# Patient Record
Sex: Male | Born: 1940
Health system: Southern US, Community
[De-identification: ages and names within clinical notes are randomized; demographics above are authoritative.]

## PROBLEM LIST (undated history)

## (undated) DIAGNOSIS — E119 Type 2 diabetes mellitus without complications: Secondary | ICD-10-CM

## (undated) DIAGNOSIS — E7800 Pure hypercholesterolemia, unspecified: Secondary | ICD-10-CM

## (undated) DIAGNOSIS — M1990 Unspecified osteoarthritis, unspecified site: Secondary | ICD-10-CM

## (undated) DIAGNOSIS — Z8546 Personal history of malignant neoplasm of prostate: Secondary | ICD-10-CM

## (undated) DIAGNOSIS — I10 Essential (primary) hypertension: Secondary | ICD-10-CM

## (undated) DIAGNOSIS — G4730 Sleep apnea, unspecified: Secondary | ICD-10-CM

## (undated) DIAGNOSIS — E1140 Type 2 diabetes mellitus with diabetic neuropathy, unspecified: Secondary | ICD-10-CM

## (undated) HISTORY — DX: Sleep apnea, unspecified: G47.30

## (undated) HISTORY — DX: Essential (primary) hypertension: I10

## (undated) HISTORY — DX: Type 2 diabetes mellitus with diabetic neuropathy, unspecified: E11.40

## (undated) HISTORY — DX: Type 2 diabetes mellitus without complications: E11.9

## (undated) HISTORY — DX: Personal history of malignant neoplasm of prostate: Z85.46

## (undated) HISTORY — DX: Unspecified osteoarthritis, unspecified site: M19.90

## (undated) HISTORY — DX: Pure hypercholesterolemia, unspecified: E78.00

---

## 1983-06-11 HISTORY — PX: BACK SURGERY: SHX140

## 1997-09-15 ENCOUNTER — Ambulatory Visit (HOSPITAL_COMMUNITY): Admission: RE | Admit: 1997-09-15 | Discharge: 1997-09-15 | Payer: Self-pay | Admitting: Neurosurgery

## 1997-10-11 ENCOUNTER — Inpatient Hospital Stay (HOSPITAL_COMMUNITY): Admission: RE | Admit: 1997-10-11 | Discharge: 1997-10-13 | Payer: Self-pay | Admitting: Neurosurgery

## 2004-11-15 ENCOUNTER — Ambulatory Visit (HOSPITAL_COMMUNITY): Admission: RE | Admit: 2004-11-15 | Discharge: 2004-11-15 | Payer: Self-pay | Admitting: Orthopedic Surgery

## 2005-06-10 HISTORY — PX: HERNIA REPAIR: SHX51

## 2006-06-10 HISTORY — PX: PROSTATE SURGERY: SHX751

## 2014-05-16 ENCOUNTER — Ambulatory Visit (INDEPENDENT_AMBULATORY_CARE_PROVIDER_SITE_OTHER): Payer: 59

## 2014-05-16 VITALS — BP 142/86 | HR 72 | Resp 12

## 2014-05-16 DIAGNOSIS — E1140 Type 2 diabetes mellitus with diabetic neuropathy, unspecified: Secondary | ICD-10-CM

## 2014-05-16 DIAGNOSIS — G629 Polyneuropathy, unspecified: Secondary | ICD-10-CM

## 2014-05-16 DIAGNOSIS — L97521 Non-pressure chronic ulcer of other part of left foot limited to breakdown of skin: Secondary | ICD-10-CM

## 2014-05-16 DIAGNOSIS — IMO0001 Reserved for inherently not codable concepts without codable children: Secondary | ICD-10-CM

## 2014-05-16 DIAGNOSIS — M2040 Other hammer toe(s) (acquired), unspecified foot: Secondary | ICD-10-CM

## 2014-05-16 NOTE — Patient Instructions (Signed)
Diabetes and Foot Care Diabetes may cause you to have problems because of poor blood supply (circulation) to your feet and legs. This may cause the skin on your feet to become thinner, break easier, and heal more slowly. Your skin may become dry, and the skin may peel and crack. You may also have nerve damage in your legs and feet causing decreased feeling in them. You may not notice minor injuries to your feet that could lead to infections or more serious problems. Taking care of your feet is one of the most important things you can do for yourself.  HOME CARE INSTRUCTIONS  Wear shoes at all times, even in the house. Do not go barefoot. Bare feet are easily injured.  Check your feet daily for blisters, cuts, and redness. If you cannot see the bottom of your feet, use a mirror or ask someone for help.  Wash your feet with warm water (do not use hot water) and mild soap. Then pat your feet and the areas between your toes until they are completely dry. Do not soak your feet as this can dry your skin.  Apply a moisturizing lotion or petroleum jelly (that does not contain alcohol and is unscented) to the skin on your feet and to dry, brittle toenails. Do not apply lotion between your toes.  Trim your toenails straight across. Do not dig under them or around the cuticle. File the edges of your nails with an emery board or nail file.  Do not cut corns or calluses or try to remove them with medicine.  Wear clean socks or stockings every day. Make sure they are not too tight. Do not wear knee-high stockings since they may decrease blood flow to your legs.  Wear shoes that fit properly and have enough cushioning. To break in new shoes, wear them for just a few hours a day. This prevents you from injuring your feet. Always look in your shoes before you put them on to be sure there are no objects inside.  Do not cross your legs. This may decrease the blood flow to your feet.  If you find a minor scrape,  cut, or break in the skin on your feet, keep it and the skin around it clean and dry. These areas may be cleansed with mild soap and water. Do not cleanse the area with peroxide, alcohol, or iodine.  When you remove an adhesive bandage, be sure not to damage the skin around it.  If you have a wound, look at it several times a day to make sure it is healing.  Do not use heating pads or hot water bottles. They may burn your skin. If you have lost feeling in your feet or legs, you may not know it is happening until it is too late.  Make sure your health care provider performs a complete foot exam at least annually or more often if you have foot problems. Report any cuts, sores, or bruises to your health care provider immediately. SEEK MEDICAL CARE IF:   You have an injury that is not healing.  You have cuts or breaks in the skin.  You have an ingrown nail.  You notice redness on your legs or feet.  You feel burning or tingling in your legs or feet.  You have pain or cramps in your legs and feet.  Your legs or feet are numb.  Your feet always feel cold. SEEK IMMEDIATE MEDICAL CARE IF:   There is increasing redness,   swelling, or pain in or around a wound.  There is a red line that goes up your leg.  Pus is coming from a wound.  You develop a fever or as directed by your health care provider.  You notice a bad smell coming from an ulcer or wound. Document Released: 05/24/2000 Document Revised: 01/27/2013 Document Reviewed: 11/03/2012 ExitCare Patient Information 2015 [employer]. This information is not intended to replace advice given to you by your health care provider. Make sure you discuss any questions you have with your health care provider.  

## 2014-05-16 NOTE — Progress Notes (Signed)
   Subjective:    Patient ID: Tim Turner, male    DOB: 1940-08-02, 73 y.o.   MRN: [id number]  HPI  PT STATED BEEN GOING TO THE WOUND CENTER FOR THE LEFT FOR 2 MONTHS. THE FOOT IS GETTING BETTER BUT STILL THERE. THE FOOT GET AGGRAVATED BY WALKING AND PRESSURE.  ALSO, WOULD LIKE TO HAVE PAIR DIABETIC SHOES. Review of Systems  HENT: Positive for hearing loss.   Cardiovascular: Positive for leg swelling.  All other systems reviewed and are negative.      Objective:   Physical Exam 73 year old white male well-developed well-nourished oriented 3 presents at this time for evaluation of diabetic foot and neuropathy issues. Patient has had recent healing of the ulcer sub-first MTP area left there is some hemorrhage a keratoses present which is debrided away at this time revealing a healed ulcer down to dermal level no discharge drainage no active infection noted. Lower extremity findings as follows vascular status is intact although diminished pedal pulses DP plus one over 4 bilateral PT thready to nonpalpable bilateral +1 edema noted bilateral Refill time 3 seconds all digits epicritic and proprioceptive sensations grossly diminished on Semmes Weinstein to the forefoot digits and arch intact sensation of the dorsum of the foot and ankle. There is normal plantar response area DTRs not elicited. Dermatologic the skin color pigment normal hair growth absent nails somewhat criptotic and incurvated. There is keratoses sub-1 bilateral due to plantar grade metatarsal digital contractures is also hammering of toes 234 and 5 with adductovarus rotated fourth and fifth digits. No x-rays taken at this time patient again recently healed ulceration at the wound center. Wearing a pair of new balance athletic type shoes. Patient does not go barefoot again has some contractures and digital deformities associated with diabetes as well as neuropathy and angiopathy issues.       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 Digital deformities with plantar grade metatarsal in history of ulceration which is resolved at this time however there is high incidence for recurrence and high likely this recurs due to his neuropathy and angiopathy situation might benefit from future vascular consultation and evaluation Doppler ultrasounds ABIs and tibialis to assess his vascular flow although at this time seems to be healing adequately. Nails somewhat criptotic patient is doing self-care may be candidate for further diabetic foot and nail care in the future as needed however at this time we'll obtain authorization for diabetic extra depth shoes and 3 pairs of custom molded dual density Plastizote inlays. The shoes inlays we helpful in preventing recurrence of ulceration and allowing patient functionality and ambulation. Authorization for shoes will be obtained from his primary physician Dr. Lisbeth Ply patient may recheck within the next month for orthotic measurement and foam impressions and management for new diabetic shoes once authorized and available. In the interim literature on diabetic footcare is also dispensed for patient's use  Harriet Masson DPM

## 2014-06-20 ENCOUNTER — Ambulatory Visit (INDEPENDENT_AMBULATORY_CARE_PROVIDER_SITE_OTHER): Payer: Medicare Other | Admitting: *Deleted

## 2014-06-20 DIAGNOSIS — IMO0001 Reserved for inherently not codable concepts without codable children: Secondary | ICD-10-CM

## 2014-06-20 DIAGNOSIS — L97521 Non-pressure chronic ulcer of other part of left foot limited to breakdown of skin: Secondary | ICD-10-CM

## 2014-06-20 NOTE — Patient Instructions (Signed)
Patient is here to get measured for diabetic shoes. Tim Turner

## 2014-06-20 NOTE — Progress Notes (Signed)
   Subjective:    Patient ID: Tim Turner, male    DOB: 11/08/1940, 74 y.o.   MRN: [id number]  HPI DIABETIC SHOE MEASUREMENT.   Review of Systems     Objective:   Physical Exam        Assessment & Plan:

## 2014-09-05 ENCOUNTER — Ambulatory Visit: Payer: Medicare Other

## 2014-09-15 ENCOUNTER — Encounter: Payer: Self-pay | Admitting: Podiatrist

## 2014-09-15 ENCOUNTER — Ambulatory Visit: Payer: Medicare Other | Admitting: Podiatrist

## 2014-09-15 VITALS — BP 133/66 | HR 73 | Resp 18

## 2014-09-15 DIAGNOSIS — M2040 Other hammer toe(s) (acquired), unspecified foot: Secondary | ICD-10-CM

## 2014-09-15 DIAGNOSIS — E1140 Type 2 diabetes mellitus with diabetic neuropathy, unspecified: Secondary | ICD-10-CM

## 2014-09-15 DIAGNOSIS — G629 Polyneuropathy, unspecified: Secondary | ICD-10-CM

## 2014-10-07 NOTE — Progress Notes (Signed)
patient presents today to pick up his diabetic shoes he states are comfortable and free of defect. He is given instructions for wear and for the use of the inserts. He'll be seen back for recheck and if any concerns arise he'll call

## 2015-05-03 ENCOUNTER — Ambulatory Visit (INDEPENDENT_AMBULATORY_CARE_PROVIDER_SITE_OTHER)
Admission: RE | Admit: 2015-05-03 | Discharge: 2015-05-03 | Disposition: A | Discharge status: Home / Self Care | Payer: Medicare Other | Source: Ambulatory Visit | Attending: Internal Medicine | Admitting: Internal Medicine

## 2015-05-03 ENCOUNTER — Ambulatory Visit (INDEPENDENT_AMBULATORY_CARE_PROVIDER_SITE_OTHER): Payer: Medicare Other | Admitting: Internal Medicine

## 2015-05-03 ENCOUNTER — Encounter: Payer: Self-pay | Admitting: Internal Medicine

## 2015-05-03 VITALS — BP 126/60 | HR 80 | Ht 70.0 in | Wt 239.6 lb

## 2015-05-03 DIAGNOSIS — R05 Cough: Secondary | ICD-10-CM | POA: Diagnosis not present

## 2015-05-03 DIAGNOSIS — G4733 Obstructive sleep apnea (adult) (pediatric): Secondary | ICD-10-CM | POA: Insufficient documentation

## 2015-05-03 DIAGNOSIS — J929 Pleural plaque without asbestos: Secondary | ICD-10-CM | POA: Insufficient documentation

## 2015-05-03 DIAGNOSIS — J189 Pneumonia, unspecified organism: Secondary | ICD-10-CM | POA: Diagnosis not present

## 2015-05-03 DIAGNOSIS — R053 Chronic cough: Secondary | ICD-10-CM | POA: Insufficient documentation

## 2015-05-03 MED ORDER — FAMOTIDINE 20 MG PO TABS: ORAL_TABLET | ORAL | Status: DC

## 2015-05-03 NOTE — Progress Notes (Signed)
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   05/03/2015 1st [facility] office visit/ Tim Turner   Chief Complaint  Patient presents with  . Advice Only    self referral-pt recently hospitalized at [facility] for pna, was told to establish with Pulm for b/l plaques on lower lobes.    developed chronic min prod  Cough x 1-2 tsp clear mucus x around a decade prior to OV   esp in am's p stirring and some better rest the day but never really gone assoc with doe x parking lot or two flights Around Apr 11 2015  much more cough more production yellow mucus/ low grade admitted [facility] 11/6-11/16 for CAP cough is some better, fever gone energy not better and sleepy a lot more than usual despite use of cpap and has not notified Tim Turner and wishes a second opinion. 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   No obvious other patterns in day to day or daytime variabilty or assoc  cp or chest tightness, subjective wheeze overt sinus or hb symptoms. No unusual exp hx or h/o childhood pna/ asthma or knowledge of premature birth.  Sleeping ok without nocturnal  or early am exacerbation  of respiratory  c/o's or need for noct saba. Also denies any obvious fluctuation of symptoms with weather or environmental changes or other aggravating or alleviating factors except as outlined above   Current Medications, Allergies, Complete Past Medical History, Past Surgical History, Family History, and Social History were reviewed in [employer] record.              Review of Systems  Constitutional: Negative for fever and unexpected weight change.  HENT: Positive  for congestion. Negative for dental problem, ear pain, nosebleeds, postnasal drip, rhinorrhea, sinus pressure, sneezing, sore throat and trouble swallowing.   Eyes: Negative for redness and itching.  Respiratory: Positive for cough and shortness of breath. Negative for chest tightness and wheezing.   Cardiovascular: Negative for palpitations and leg swelling.  Gastrointestinal: Negative for nausea and vomiting.  Genitourinary: Negative for dysuria.  Musculoskeletal: Negative for joint swelling.  Skin: Negative for rash.  Neurological: Negative for headaches.  Hematological: Does not bruise/bleed easily.  Psychiatric/Behavioral: Negative for dysphoric mood. The patient is not nervous/anxious.        Objective:   Physical Exam  amb wm nad  Wt Readings from Last 3 Encounters:  05/03/15 239 lb 9.6 oz (108.682 kg)    Vital signs reviewed   HEENT: nl dentition, turbinates, and oropharynx. Nl external ear canals without cough reflex   NECK :  without JVD/Nodes/TM/ nl carotid upstrokes bilaterally   LUNGS: no acc muscle use, clear to A and P bilaterally without cough on insp or exp maneuvers   CV:  RRR  no s3 or murmur or increase in P2, no edema   ABD:  soft and nontender with nl excursion in the supine position. No bruits or organomegaly, bowel sounds nl  MS:  warm without deformities, calf tenderness, cyanosis or clubbing  SKIN: warm and dry without lesions  NEURO:  alert, approp, no deficits     CXR PA and Lateral:   05/03/2015 :    I personally reviewed images and agree with radiology impression as follows:   Stable bilateral noncalcified pleural plaque. No active lung disease.      Assessment & Plan:

## 2015-05-03 NOTE — Assessment & Plan Note (Signed)
Complicated by dm and osa  Body mass index is 34.38 kg/(m^2).  No results found for: TSH   Contributing to gerd tendency/ doe/reviewed the need and the process to achieve and maintain neg calorie balance > defer f/u primary care including intermittently monitoring thyroid status

## 2015-05-03 NOTE — Assessment & Plan Note (Signed)
The most common causes of chronic cough in immunocompetent adults include the following: upper airway cough syndrome (UACS), previously referred to as postnasal drip syndrome (PNDS), which is caused by variety of rhinosinus conditions; (2) asthma; (3) GERD; (4) chronic bronchitis from cigarette smoking or other inhaled environmental irritants; (5) nonasthmatic eosinophilic bronchitis; and (6) bronchiectasis.   These conditions, singly or in combination, have accounted for up to 94% of the causes of chronic cough in prospective studies.   Other conditions have constituted no >6% of the causes in prospective studies These have included bronchogenic carcinoma, chronic interstitial pneumonia, sarcoidosis, left ventricular failure, ACEI-induced cough, and aspiration from a condition associated with pharyngeal dysfunction.    Chronic cough is often simultaneously caused by more than one condition. A single cause has been found from 38 to 82% of the time, multiple causes from 18 to 62%. Multiply caused cough has been the result of three diseases up to 42% of the time.       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   These are the same pts (now being labeled as having "irritable larynx syndrome" by some cough centers) who not infrequently have a history of having failed to tolerate ace inhibitors,  dry powder inhalers or biphosphonates or report having atypical reflux symptoms that don't respond to standard doses of PPI , and are easily confused as  having aecopd or asthma flares by even experienced allergists/ pulmonologists.   The first step is to maximize acid suppression and eliminate cyclical coughing then regroup with pfts  I had an extended discussion with the patient reviewing all relevant studies completed to date and  lasting 35 min/ 60 min ov  1) Explained: The standardized cough guidelines published in Chest by Lissa Morales in 2006 are still the best available and consist of a multiple step process (up to 12!) , not a single office visit,  and are intended  to address this problem logically,  with an alogrithm dependent on response to empiric treatment at  each progressive step  to determine a specific diagnosis with  minimal addtional testing needed. Therefore if adherence is an issue or can't be accurately verified,  it's very unlikely the standard evaluation and treatment will be successful here.    Furthermore, response to therapy (other than acute cough suppression, which should only be used short term with avoidance of narcotic containing cough syrups if possible), can be a gradual process for which the patient may perceive immediate benefit.  Unlike going to an eye doctor where the best perscription is almost always the first one and is immediately effective, this is almost never the case in the management of chronic cough syndromes. Therefore the patient needs to commit up front to consistently adhere to recommendations  for up to 6 weeks of therapy directed at the likely underlying problem(s) before the response can be reasonably evaluated.     1)  Each maintenance medication was reviewed in detail including most importantly the difference between maintenance and prns and  under what circumstances the prns are to be triggered using an action plan format that is not reflected in the computer generated alphabetically organized AVS.    Please see instructions for details which were reviewed in writing and the patient given a  copy highlighting the part that I personally wrote and discussed at today's ov.   See instructions for specific recommendations which were reviewed directly with the patient who was given a copy with highlighter outlining the key components.

## 2015-05-03 NOTE — Assessment & Plan Note (Signed)
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 He declined returning to Dr.[name] but I do believe he is using his cpap machine appropriately and he did agree to having it undergo maintenance and a download to establish whether or not it is effective. In the meantime I strongly cautioned against driving at all if all sleepy and he reported to me that that's never been an issue for him since starting on CPAP.

## 2015-05-03 NOTE — Patient Instructions (Addendum)
Please remember to go to the x-ray department downstairs for your tests - we will call you with the results when they are available.  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    GERD (REFLUX)  is an extremely common cause of respiratory symptoms just like yours , many times with no obvious heartburn at all.    It can be treated with medication, but also with lifestyle changes including elevation of the head of your bed (ideally with 6 inch  bed blocks),  Smoking cessation, avoidance of late meals, excessive alcohol, and avoid fatty foods, chocolate, peppermint, colas, red wine, and acidic juices such as orange juice.  NO MINT OR MENTHOL PRODUCTS SO NO COUGH DROPS  USE SUGARLESS CANDY INSTEAD (Jolley ranchers or Stover's or Life Savers) or even ice chips will also do - the key is to swallow to prevent all throat clearing. NO OIL BASED VITAMINS - use powdered substitutes - stop the fish oil and eat more fish  Please see patient coordinator before you leave today  to schedule a cpap maintence and download   Please schedule a follow up office visit in 6 weeks, call sooner if needed with pfts

## 2015-05-03 NOTE — Assessment & Plan Note (Signed)
cxr 05/03/2015 c/w resolved cap / no further f/u needed

## 2015-05-03 NOTE — Assessment & Plan Note (Signed)
Present since at least March 2012 by CT scan with a history of asbestos exposure but stopped in the early 80s  This places him at risk of developing interstitial lung disease/asbestosis but there is no evidence that he has this at present and will return for full PFTs and baseline walking saturation.

## 2015-06-16 ENCOUNTER — Ambulatory Visit: Payer: Medicare Other | Admitting: Internal Medicine

## 2015-06-19 ENCOUNTER — Other Ambulatory Visit: Payer: Self-pay | Admitting: Internal Medicine

## 2015-06-19 MED ORDER — FAMOTIDINE 20 MG PO TABS: ORAL_TABLET | ORAL | Status: DC

## 2015-07-04 ENCOUNTER — Ambulatory Visit (INDEPENDENT_AMBULATORY_CARE_PROVIDER_SITE_OTHER): Payer: Medicare Other | Admitting: Internal Medicine

## 2015-07-04 ENCOUNTER — Encounter: Payer: Self-pay | Admitting: Internal Medicine

## 2015-07-04 ENCOUNTER — Ambulatory Visit (INDEPENDENT_AMBULATORY_CARE_PROVIDER_SITE_OTHER)
Admission: RE | Admit: 2015-07-04 | Discharge: 2015-07-04 | Disposition: A | Discharge status: Home / Self Care | Payer: Medicare Other | Source: Ambulatory Visit | Attending: Internal Medicine | Admitting: Internal Medicine

## 2015-07-04 VITALS — BP 144/68 | HR 78 | Ht 69.5 in | Wt 247.0 lb

## 2015-07-04 DIAGNOSIS — R05 Cough: Secondary | ICD-10-CM

## 2015-07-04 DIAGNOSIS — J189 Pneumonia, unspecified organism: Secondary | ICD-10-CM | POA: Diagnosis not present

## 2015-07-04 DIAGNOSIS — R053 Chronic cough: Secondary | ICD-10-CM

## 2015-07-04 DIAGNOSIS — J929 Pleural plaque without asbestos: Secondary | ICD-10-CM

## 2015-07-04 DIAGNOSIS — G4733 Obstructive sleep apnea (adult) (pediatric): Secondary | ICD-10-CM | POA: Diagnosis not present

## 2015-07-04 LAB — PULMONARY FUNCTION TEST
DL/VA % PRED: 101 %
DL/VA: 4.63 ml/min/mmHg/L
DLCO unc % pred: 73 %
DLCO unc: 23.19 ml/min/mmHg
FEF 25-75 POST: 1.9 L/s
FEF 25-75 Pre: 1.58 L/sec
FEF2575-%CHANGE-POST: 20 %
FEF2575-%PRED-POST: 84 %
FEF2575-%PRED-PRE: 69 %
FEV1-%CHANGE-POST: 2 %
FEV1-%PRED-PRE: 74 %
FEV1-%Pred-Post: 76 %
FEV1-PRE: 2.29 L
FEV1-Post: 2.35 L
FEV1FVC-%CHANGE-POST: 4 %
FEV1FVC-%PRED-PRE: 100 %
FEV6-%Change-Post: 0 %
FEV6-%Pred-Post: 77 %
FEV6-%Pred-Pre: 77 %
FEV6-POST: 3.07 L
FEV6-Pre: 3.07 L
FEV6FVC-%Change-Post: 1 %
FEV6FVC-%PRED-POST: 106 %
FEV6FVC-%Pred-Pre: 105 %
FVC-%CHANGE-POST: -1 %
FVC-%PRED-POST: 72 %
FVC-%PRED-PRE: 73 %
FVC-POST: 3.07 L
FVC-PRE: 3.11 L
PRE FEV1/FVC RATIO: 73 %
PRE FEV6/FVC RATIO: 99 %
Post FEV1/FVC ratio: 77 %
Post FEV6/FVC ratio: 100 %
RV % pred: 93 %
RV: 2.33 L
TLC % pred: 79 %
TLC: 5.49 L

## 2015-07-04 NOTE — Progress Notes (Signed)
PFT done today. 

## 2015-07-04 NOTE — Patient Instructions (Addendum)
mucinex dm up to 1200 mg every 12 hours as needed for cough   GERD (REFLUX)  is an extremely common cause of respiratory symptoms just like yours , many times with no obvious heartburn at all.    It can be treated with medication, but also with lifestyle changes including elevation of the head of your bed (ideally with 6 inch  bed blocks),  Smoking cessation, avoidance of late meals, excessive alcohol, and avoid fatty foods, chocolate, peppermint, colas, red wine, and acidic juices such as orange juice.  NO MINT OR MENTHOL PRODUCTS SO NO COUGH DROPS  USE SUGARLESS CANDY INSTEAD (Jolley ranchers or Stover's or Life Savers) or even ice chips will also do - the key is to swallow to prevent all throat clearing. NO OIL BASED VITAMINS - use powdered substitutes.   Work on [employer] loss     Your sleep test looks good so we will set you up for a sleep doctor in 6 months

## 2015-07-04 NOTE — Progress Notes (Signed)
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   History of Present Illness  05/03/2015 1st [facility] office visit/ Tim Turner   Chief Complaint  Patient presents with  . Advice Only    self referral-pt recently hospitalized at [facility] for pna, was told to establish with Pulm for b/l plaques on lower lobes.    developed chronic min prod  Cough x 1-2 tsp clear mucus x around a decade prior to OV   esp in am's p stirring and some better rest the day but never really gone assoc with doe x parking lot or two flights Around Apr 11 2015  much more cough more production yellow mucus/ low grade admitted [facility] 11/6-11/16 for CAP cough is some better, fever gone energy not better and sleepy a lot more than usual despite use of cpap and has not notified Dr Tim Turner and wishes a second opinion. 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   GERD  Diet         07/04/2015  f/u ov/Tim Turner re: cough/ osa  Chief Complaint  Patient presents with  . Follow-up    PFT and CXR done today. Pt c/o SOB for the past wk- walking "not very far". He also has some prod cough with clear sputum. He has been using neb 1-2 x per day on average for the past wk.    he states he was feeling much better until he caught a cold over the last week but feels his symptoms are well controlled with the use of  albuterol.  No obvious day to day or daytime variability or assoc   cp or chest tightness, subjective wheeze or overt sinus or hb symptoms. No unusual exp hx or h/o childhood pna/ asthma or knowledge of premature birth.  Sleeping ok without nocturnal  or early am exacerbation  of respiratory  c/o's or need for noct saba. Also denies any obvious fluctuation of symptoms with weather or environmental changes or other aggravating or alleviating factors except as outlined above   Current Medications, Allergies, Complete Past Medical History, Past Surgical History, Family History, and Social History were reviewed in [employer] record.  ROS  The following are not active complaints unless bolded sore throat, dysphagia, dental problems, itching, sneezing,  nasal congestion or excess/ purulent secretions, ear ache,   fever, chills, sweats, unintended wt loss, classically pleuritic or exertional cp, hemoptysis,  orthopnea pnd or leg swelling, presyncope, palpitations, abdominal pain, anorexia, nausea, vomiting, diarrhea  or change in bowel or bladder habits, change in stools or urine, dysuria,hematuria,  rash, arthralgias, visual complaints, headache, numbness, weakness or ataxia or problems with walking or coordination,  change in mood/affect or memory.  Objective:   Physical Exam  amb wm nad   Wt Readings from Last 3 Encounters:  07/04/15 247 lb (112.038 kg)  05/03/15 239 lb 9.6 oz (108.682 kg)    Vital signs reviewed   HEENT: nl dentition, turbinates, and oropharynx. Nl external ear canals without cough reflex   NECK :  without JVD/Nodes/TM/ nl carotid upstrokes bilaterally   LUNGS: no acc muscle use, clear to A and P bilaterally without cough on insp or exp maneuvers   CV:  RRR  no s3 or murmur or increase in P2, no edema   ABD:  soft and nontender with nl excursion in the supine position. No bruits or organomegaly, bowel sounds nl  MS:  warm  without deformities, calf tenderness, cyanosis or clubbing  SKIN: warm and dry without lesions    NEURO:  alert, approp, no deficits      CXR PA and Lateral:   07/04/2015 :    I personally reviewed images and agree with radiology impression as follows:   1. Cardiomegaly. No overt pulmonary edema. 2. No acute cardiopulmonary disease. Stable bilateral pleural noncalcified plaques are noted. Chest is stable from 05/03/2015.        Assessment & Plan:

## 2015-07-05 NOTE — Assessment & Plan Note (Addendum)
Present since at least March 2012 by CT scan with a history of asbestos exposure but stopped in the early 80s  Malignant transformation of plaques is very rare and No evidence of significant ILD clinically or radiographically though he is at risk and follow up yearly reasonable but he'll need that here anyway for his OSA.

## 2015-07-05 NOTE — Assessment & Plan Note (Signed)
PFT's  07/04/2015  FEV1 2/35 (76 % ) ratio 77  p 2 % improvement from saba with DLCO  73 % corrects to 101 % for alv volume    Despite flare with an apparent uri there is no evidence at all of asthma / cough vairant or otherwise here so no need for chronic rx other than continue efforts to control GERD through diet/max acid suppression and f/u prn  I had an extended discussion with the patient reviewing all relevant studies completed to date and  lasting 15 to 20 minutes of a 25 minute visit    Each maintenance medication was reviewed in detail including most importantly the difference between maintenance and prns and under what circumstances the prns are to be triggered using an action plan format that is not reflected in the computer generated alphabetically organized AVS.    Please see instructions for details which were reviewed in writing and the patient given a copy highlighting the part that I personally wrote and discussed at today's ov.

## 2015-07-05 NOTE — Assessment & Plan Note (Signed)
Download completed 07/03/15 > 4 h 100% ,  autoset 5/20,  AHI 2.4   Clearly using it, adequate setting, elimination of daytime symtpoms> referred to sleep medicine for continued f/u @ 6 m

## 2015-07-05 NOTE — Assessment & Plan Note (Signed)
cxr clear/ no further f/u needed

## 2015-07-05 NOTE — Assessment & Plan Note (Addendum)
Complicated by dm and osa and ERV 46% on pfts 07/04/2015  Body mass index is 35.96  So still trending up   No results found for: TSH   Contributing to gerd tendency/ doe/reviewed the need and the process to achieve and maintain neg calorie balance > defer f/u primary care including intermittently monitoring thyroid status

## 2015-07-21 ENCOUNTER — Encounter: Payer: Self-pay | Admitting: Internal Medicine

## 2015-10-01 ENCOUNTER — Other Ambulatory Visit: Payer: Self-pay | Admitting: Internal Medicine

## 2016-01-01 ENCOUNTER — Other Ambulatory Visit (INDEPENDENT_AMBULATORY_CARE_PROVIDER_SITE_OTHER): Payer: Medicare Other

## 2016-01-01 ENCOUNTER — Ambulatory Visit (INDEPENDENT_AMBULATORY_CARE_PROVIDER_SITE_OTHER): Payer: Medicare Other | Admitting: Internal Medicine

## 2016-01-01 ENCOUNTER — Encounter: Payer: Self-pay | Admitting: Internal Medicine

## 2016-01-01 VITALS — BP 128/76 | HR 76 | Ht 70.0 in | Wt 232.2 lb

## 2016-01-01 DIAGNOSIS — G4733 Obstructive sleep apnea (adult) (pediatric): Secondary | ICD-10-CM

## 2016-01-01 DIAGNOSIS — R011 Cardiac murmur, unspecified: Secondary | ICD-10-CM | POA: Diagnosis not present

## 2016-01-01 DIAGNOSIS — E032 Hypothyroidism due to medicaments and other exogenous substances: Secondary | ICD-10-CM

## 2016-01-01 LAB — TSH: TSH: 1.44 u[IU]/mL (ref 0.35–4.50)

## 2016-01-01 MED ORDER — METHYLPHENIDATE HCL 10 MG PO TABS: ORAL_TABLET | ORAL | 0 refills | Status: DC

## 2016-01-01 NOTE — Assessment & Plan Note (Signed)
Tight sounding high-pitched murmur without symptoms of syncope or chest pain. Plan-echocardiogram

## 2016-01-01 NOTE — Assessment & Plan Note (Signed)
Download and wife's report both indicate excellent compliance and control. He is quite comfortable with pressure auto range 5-20 using nasal mask. Current machine about 37 or 74 years old. Not clear why he he continues to describe hypersomnolence. This was not an issue when he first began using CPAP and may be a separate problem. He denies cataplexy or sleep paralysis. He does notice leg jerks and we may need to try treating with Requip. Plan-discussed basic sleep hygiene. Okay to try caffeine tablet or very cautious use of Ritalin as discussed in detail. Lab for TSH to exclude hypothyroid. Consider if PSG with an MSLT would be required.

## 2016-01-01 NOTE — Assessment & Plan Note (Signed)
Would medically benefit from significant weight loss. Consider bariatric referral for counseling.

## 2016-01-01 NOTE — Patient Instructions (Signed)
Suggest trying otc caffeine tablet like NoDoz  Order- lab TSH    Dx hypothyroid  Script printed to try Ritalin  Naps will work better than any drug, so take naps still,m when you can do it safely.   Order- schedule Echocardiogram   Dx systolic murmur  We can continue CPAP auto 5-20/ [facility]  Follow up with Dr Melvyn Novas for your breathing as planned

## 2016-01-01 NOTE — Progress Notes (Signed)
01/01/2016-75 year old male with history OSA/CPAP referred by Dr. Melvyn Novas for sleep medicine evaluation. Patient had been diagnosed and treated by Dr. [name], but seeking second opinion. NPSG 08/03/03- AHI 29/ hr, desaturation to 74%, body weight 210 pounds CPAP auto 5-20/[facility] Uses CPAP nightly-[facility] in [place]. Pt is a Administrator. Wife is here and helps with history. They are team commercial drivers. Current CPAP machine is 12 or 74 years old and working well. He has been comfortable with pressure using a nasal mask. Wife says he uses it every night, confirmed by download report. Rarely snores through. 2 or 3 years he has been much sleepier during the day despite 10 hours of apparently comfortable restful sleep at night. Gets drowsy and will go take a nap for 2 hours most days after lunch. No trouble staying awake if he is doing something, like driving. Wife says he moves his legs a lot with no sleepwalking. He denies symptoms suggestive of cataplexy or sleep paralysis. ENT-tonsillectomy as a child Dr. Melvyn Novas is helping him with chronic lung disease. Doesn't think he has ever been tested for thyroid function.  Prior to Admission medications   Medication Sig Start Date End Date Taking? Authorizing Provider  amLODipine (NORVASC) 10 MG tablet Take 10 mg by mouth daily.  03/04/14  Yes Historical Provider, MD  citalopram (CELEXA) 20 MG tablet Take 20 mg by mouth daily.  04/13/14  Yes Historical Provider, MD  famotidine (PEPCID) 20 MG tablet TAKE 1 TABLET BY MOUTH AT BEDTIME 10/02/15  Yes Tanda Rockers, MD  gemfibrozil (LOPID) 600 MG tablet Take 600 mg by mouth 2 (two) times daily before a meal.  05/04/14  Yes Historical Provider, MD  glipiZIDE (GLUCOTROL XL) 10 MG 24 hr tablet Take 10 mg by mouth daily with breakfast.   Yes Historical Provider, MD  ipratropium-albuterol (DUONEB) 0.5-2.5 (3) MG/3ML SOLN Take 3 mLs by nebulization every 6 (six) hours as needed.   Yes Historical  Provider, MD  losartan (COZAAR) 50 MG tablet Take 50 mg by mouth daily.  03/24/14  Yes Historical Provider, MD  metFORMIN (GLUCOPHAGE-XR) 500 MG 24 hr tablet Take 1,000 mg by mouth daily with breakfast.  03/15/14  Yes Historical Provider, MD  metoprolol tartrate (LOPRESSOR) 25 MG tablet Take 25 mg by mouth 2 (two) times daily.   Yes Historical Provider, MD  pantoprazole (PROTONIX) 40 MG tablet Take 40 mg by mouth daily.  03/05/14  Yes Historical Provider, MD  methylphenidate (RITALIN) 10 MG tablet 1 twice daily as needed 01/01/16   Deneise Lever, MD   Past Medical History:  Diagnosis Date  . Arthritis   . Diabetes mellitus without complication ([place])   . History of prostate cancer   . Hypercholesteremia   . Hypertension   . Sleep apnea    Past Surgical History:  Procedure Laterality Date  . BACK SURGERY  1985   nerve decompression  . HERNIA REPAIR  2007   2 surgeries  . PROSTATE SURGERY  2008   Family History  Problem Relation Age of Onset  . Heart disease Mother   . Breast cancer Mother   . Heart disease Father    Social History   Social History  . Marital status: Married    Spouse name: N/A  . Number of children: N/A  . Years of education: N/A   Occupational History  . Not on file.   Social History Main Topics  . Smoking status: Former Smoker  Packs/day: 4.00    Years: 35.00    Types: Cigarettes    Quit date: 05/02/1985  . Smokeless tobacco: Never Used  . Alcohol use No  . Drug use: No  . Sexual activity: Not on file   Other Topics Concern  . Not on file   Social History Narrative  . No narrative on file   ROS-see HPI   Negative unless "+" Constitutional:    weight loss, night sweats, fevers, chills,+ fatigue, lassitude. HEENT:    headaches, difficulty swallowing, tooth/dental problems, sore throat,       sneezing, itching, ear ache, nasal congestion, post nasal drip, snoring CV:    chest pain, orthopnea, PND, swelling in lower extremities, anasarca,                                                       dizziness, palpitations Resp:   shortness of breath with exertion or at rest.                productive cough,   non-productive cough, coughing up of blood.              change in color of mucus.  wheezing.   Skin:    rash or lesions. GI:  No-   heartburn, indigestion, abdominal pain, nausea, vomiting, diarrhea,                 change in bowel habits, loss of appetite GU: dysuria, change in color of urine, no urgency or frequency.   flank pain. MS:   joint pain, stiffness, decreased range of motion, back pain. Neuro-     nothing unusual Psych:  change in mood or affect.  depression or anxiety.   memory loss.  OBJ- Physical Exam General- Alert, Oriented, Affect-appropriate, Distress- none acute + morbidly obese Skin- rash-none, lesions- none, excoriation- none Lymphadenopathy- none Head- atraumatic            Eyes- Gross vision intact, PERRLA, conjunctivae and secretions clear            Ears- Hearing, canals-normal            Nose- Clear, no-Septal dev, mucus, polyps, erosion, perforation             Throat- Mallampati IV , mucosa clear , drainage- none, tonsils- atrophic Neck- flexible , trachea midline, no stridor , thyroid nl, carotid no bruit Chest - symmetrical excursion , unlabored           Heart/CV- RRR ,  Murmur+ 1/6 high-pitched systolic left sternal border , no gallop  , no rub, nl s1 s2                           - JVD- none , edema- none, stasis changes- none, varices- none           Lung- clear to P&A, wheeze- none, cough- none , dullness-none, rub- none           Chest wall-  Abd-  Br/ Gen/ Rectal- Not done, not indicated Extrem- cyanosis- none, clubbing, none, atrophy- none, strength- nl Neuro- grossly intact to observation

## 2016-01-04 ENCOUNTER — Telehealth: Payer: Self-pay | Admitting: Internal Medicine

## 2016-01-04 NOTE — Telephone Encounter (Signed)
Notes Recorded by Deneise Lever, MD on 01/02/2016 at 10:29 AM EDT Thyroid test was normal  Results have been explained to patient, pt expressed understanding. Nothing further needed.

## 2016-01-12 ENCOUNTER — Other Ambulatory Visit (HOSPITAL_COMMUNITY): Payer: Medicare Other

## 2016-01-16 ENCOUNTER — Ambulatory Visit (HOSPITAL_COMMUNITY): Payer: Medicare Other | Attending: Cardiology

## 2016-01-16 ENCOUNTER — Other Ambulatory Visit: Payer: Self-pay

## 2016-01-16 ENCOUNTER — Encounter: Payer: Self-pay | Admitting: Internal Medicine

## 2016-01-16 DIAGNOSIS — E119 Type 2 diabetes mellitus without complications: Secondary | ICD-10-CM | POA: Insufficient documentation

## 2016-01-16 DIAGNOSIS — I340 Nonrheumatic mitral (valve) insufficiency: Secondary | ICD-10-CM | POA: Insufficient documentation

## 2016-01-16 DIAGNOSIS — Z87891 Personal history of nicotine dependence: Secondary | ICD-10-CM | POA: Insufficient documentation

## 2016-01-16 DIAGNOSIS — G4733 Obstructive sleep apnea (adult) (pediatric): Secondary | ICD-10-CM | POA: Diagnosis not present

## 2016-01-16 DIAGNOSIS — I77810 Thoracic aortic ectasia: Secondary | ICD-10-CM | POA: Insufficient documentation

## 2016-01-16 DIAGNOSIS — R011 Cardiac murmur, unspecified: Secondary | ICD-10-CM | POA: Diagnosis not present

## 2016-01-16 DIAGNOSIS — I341 Nonrheumatic mitral (valve) prolapse: Secondary | ICD-10-CM | POA: Diagnosis not present

## 2016-01-16 DIAGNOSIS — Z6833 Body mass index (BMI) 33.0-33.9, adult: Secondary | ICD-10-CM | POA: Insufficient documentation

## 2016-01-16 DIAGNOSIS — E785 Hyperlipidemia, unspecified: Secondary | ICD-10-CM | POA: Insufficient documentation

## 2016-01-16 DIAGNOSIS — I119 Hypertensive heart disease without heart failure: Secondary | ICD-10-CM | POA: Diagnosis not present

## 2016-01-16 LAB — ECHOCARDIOGRAM COMPLETE
AOASC: 35 cm
CHL CUP LV S' LATERAL: 12.6 cm/s
EERAT: 12.16
EWDT: 246 ms
FS: 42 % (ref 28–44)
IVS/LV PW RATIO, ED: 1.13
LA diam index: 2.21 cm/m2
LA vol index: 18.9 mL/m2
LASIZE: 49 mm
LAVOL: 42 mL
LAVOLA4C: 37 mL
LEFT ATRIUM END SYS DIAM: 49 mm
LV E/e' medial: 12.16
LV E/e'average: 12.16
LV PW d: 9.28 mm — AB (ref 0.6–1.1)
LV TDI E'LATERAL: 8.88
LV TDI E'MEDIAL: 6.03
LV e' LATERAL: 8.88 cm/s
LVOT VTI: 21.1 cm
LVOT area: 3.8 cm2
LVOT diameter: 22 mm
LVOT peak grad rest: 6 mmHg
LVOTPV: 122 cm/s
LVOTSV: 80 mL
MV Dec: 246
MV pk A vel: 104 m/s
MV pk E vel: 108 m/s
MVPG: 5 mmHg
Reg peak vel: 235 cm/s
TR max vel: 235 cm/s

## 2016-01-18 ENCOUNTER — Telehealth: Payer: Self-pay | Admitting: Internal Medicine

## 2016-01-18 NOTE — Telephone Encounter (Signed)
Called and spoke to pt's wife. Informed her of the results and recs per CY. Pt verbalized understanding and denied any further questions or concerns at this time.   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 He should discuss this with his primary care physician to see if they want him to see a cardiologist.

## 2016-01-18 NOTE — Progress Notes (Signed)
Called and spoke to pt's wife. Informed her of the results and recs per CY. Pt verbalized understanding and denied any further questions or concerns at this time.

## 2016-01-20 ENCOUNTER — Other Ambulatory Visit: Payer: Self-pay | Admitting: Internal Medicine

## 2016-03-13 ENCOUNTER — Encounter: Payer: Self-pay | Admitting: Acute Care

## 2016-03-13 ENCOUNTER — Telehealth: Payer: Self-pay | Admitting: Acute Care

## 2016-03-13 ENCOUNTER — Other Ambulatory Visit: Payer: Self-pay | Admitting: Acute Care

## 2016-03-13 ENCOUNTER — Ambulatory Visit (INDEPENDENT_AMBULATORY_CARE_PROVIDER_SITE_OTHER): Payer: Medicare Other | Admitting: Acute Care

## 2016-03-13 ENCOUNTER — Ambulatory Visit (INDEPENDENT_AMBULATORY_CARE_PROVIDER_SITE_OTHER)
Admission: RE | Admit: 2016-03-13 | Discharge: 2016-03-13 | Disposition: A | Discharge status: Home / Self Care | Payer: Medicare Other | Source: Ambulatory Visit | Attending: Acute Care | Admitting: Acute Care

## 2016-03-13 VITALS — BP 114/62 | HR 76 | Temp 98.0°F | Ht 70.0 in | Wt 238.0 lb

## 2016-03-13 DIAGNOSIS — R053 Chronic cough: Secondary | ICD-10-CM

## 2016-03-13 DIAGNOSIS — R1310 Dysphagia, unspecified: Secondary | ICD-10-CM

## 2016-03-13 DIAGNOSIS — J181 Lobar pneumonia, unspecified organism: Secondary | ICD-10-CM

## 2016-03-13 DIAGNOSIS — R05 Cough: Secondary | ICD-10-CM

## 2016-03-13 DIAGNOSIS — J189 Pneumonia, unspecified organism: Secondary | ICD-10-CM

## 2016-03-13 MED ORDER — AZITHROMYCIN 250 MG PO TABS: ORAL_TABLET | ORAL | 0 refills | Status: DC

## 2016-03-13 MED ORDER — DOXYCYCLINE HYCLATE 100 MG PO TABS: 100.0000 mg | ORAL_TABLET | Freq: Two times a day (BID) | ORAL | 0 refills | Status: DC

## 2016-03-13 NOTE — Progress Notes (Signed)
History of Present Illness Tim Turner is a 75 y.o. male with cough and OSA followed by Dr. Melvyn Novas and Dr. Annamaria Boots.  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  03/13/2016 Acute OV: Pt. Presents today with  Chronic cough which is non-productive since January 2017. He  States  that he is constantly clearing his throat. He states that he is easily choked on food, and water. He is compliant with his Protonix, Claritin   and his Duonebs. He is also compliant with his  CPAP. His wife states he is not following the GERD diet. He eats a majority of fried foods. He is complaining of a fever of 99 x last few days.He states that his sputum is clear.He does have a sore throat form cough. He was seen by Dr. Melvyn Novas for these same symptoms and was placed on Protonix and given the GERD diet.We will reinforce cough  strategies and order a swallow evaluation and CXR today in the clinic to rule out aspiration pneumonia..Frequent coughing and throat clearing in the office today.He denies chest pain, orthopnea hemoptysis, leg pain or swelling.   Tests CXR 03/13/2016:>> New opacity in the right upper lobe extending to the right apex. This is likely pneumonia. Neoplastic disease is possible. If the patient's symptoms are consistent with pneumonia, recommend follow-up radiographs after treatment to document improvement. If symptoms are atypical, recommend follow-up chest CT with contrast for further assessment.  Swallow Evaluation>> pending   Past medical hx Past Medical History:  Diagnosis Date  . Arthritis   . Diabetes mellitus without complication ([place])   . History of prostate cancer   . Hypercholesteremia   . Hypertension   . Sleep apnea      Past surgical hx, Family hx, Social hx all  reviewed.  Current Outpatient Prescriptions on File Prior to Visit  Medication Sig  . amLODipine (NORVASC) 10 MG tablet Take 10 mg by mouth daily.   . citalopram (CELEXA) 20 MG tablet Take 20 mg by mouth daily.   . famotidine (PEPCID) 20 MG tablet TAKE 1 TABLET BY MOUTH AT BEDTIME  . gemfibrozil (LOPID) 600 MG tablet Take 600 mg by mouth 2 (two) times daily before a meal.   . glipiZIDE (GLUCOTROL XL) 10 MG 24 hr tablet Take 10 mg by mouth daily with breakfast.  . ipratropium-albuterol (DUONEB) 0.5-2.5 (3) MG/3ML SOLN Take 3 mLs by nebulization every 6 (six) hours as needed.  Marland Kitchen losartan (COZAAR) 50 MG tablet Take 50 mg by mouth daily.   . metFORMIN (GLUCOPHAGE-XR) 500 MG 24 hr tablet Take 1,000 mg by mouth daily with breakfast.   . methylphenidate (RITALIN) 10 MG tablet 1 twice daily as needed  . metoprolol tartrate (LOPRESSOR) 25 MG tablet Take 25 mg by mouth 2 (two) times daily.  . pantoprazole (PROTONIX) 40 MG tablet Take 40 mg by mouth daily.    No current facility-administered medications on file prior to visit.      Allergies  Allergen Reactions  . Morphine And Related Itching    Review Of Systems:  Constitutional:   No  weight loss, night sweats, + Fevers, no chills, fatigue, or  lassitude.  HEENT:   No headaches,  Difficulty swallowing,  Tooth/dental problems, or  Sore throat,  No sneezing, itching, ear ache, nasal congestion, post nasal drip,   CV:  No chest pain,  Orthopnea, PND, swelling in lower extremities, anasarca, dizziness, palpitations, syncope.   GI  No heartburn, indigestion, abdominal pain, nausea, vomiting, diarrhea, change in bowel habits, loss of appetite, bloody stools.   Resp: + shortness of breath with exertion less at rest.  No excess mucus, no productive cough,  + non-productive cough,  No coughing up of blood.  No change in color of mucus.  No wheezing.  No chest wall deformity  Skin: no rash or lesions.  GU: no dysuria, change in  color of urine, no urgency or frequency.  No flank pain, no hematuria   MS:  No joint pain or swelling.  No decreased range of motion.  No back pain.  Psych:  No change in mood or affect. No depression or anxiety.  No memory loss.   Vital Signs BP 114/62 (BP Location: Left Arm, Cuff Size: Normal)   Pulse 76   Temp 98 F (36.7 C) (Oral)   Ht '5\' 10"'$  (1.778 m)   Wt 238 lb (108 kg)   SpO2 93%   BMI 34.15 kg/m    Physical Exam:  General- No distress,  A&Ox3, obese and pleasant ENT: No sinus tenderness, TM clear, pale nasal mucosa, no oral exudate,no post nasal drip, no LAN Cardiac: S1, S2, regular rate and rhythm, no murmur Chest: No wheeze/ rales/ dullness; no accessory muscle use, no nasal flaring, no sternal retractions Abd.: Soft Non-tender Ext: No clubbing cyanosis, edema Neuro:  normal strength Skin: No rashes, warm and dry Psych: normal mood and behavior   Assessment/Plan  Chronic cough Continued Chronic cough Non-Compliant with GERD diet Plan: Follow the GERD diet. Continue Pantoprazole (protonix) 40 mg   Take  30-60 min before first meal of the day and Pepcid (famotidine)  20 mg one @  bedtime  Avoid mint, menthol and chocolate. Sips of water instead of throat clearing. Sugar free jolly ranchers for throat  Soothing. Delsym 1 teaspoon every 12 hours for cough. Work on losing weight, as you are deconditioned. Work on exercising more. Follow up with Dr. Melvyn Novas in 3 weeks as is scheduled. Please contact office for sooner follow up if symptoms do not improve or worsen or seek emergency care     Difficulty swallowing liquids Difficulty Swallowing Liquids Low Grade Fever Plan: We will send in a prescription for a z-pack today. CXR today. We will schedule you for a swallow evaluation Follow up with Dr. Melvyn Novas as is scheduled . Please contact office for sooner follow up if symptoms do not improve or worsen or seek emergency care .   CAP (community acquired  pneumonia) New right upper lobe opacity extending to right apex. Cough with low grade fever. Concern for aspiration per wife's history of getting choked on food. Concern for neoplastic disease with smoking history. Plan: Doxycycline 100 mg twice daily x 1 week. CXR today. We will schedule you for a swallow study. Follow the GERD diet. Continue Pantoprazole (protonix) 40 mg   Take  30-60 min before first meal of the day and Pepcid (famotidine)  20 mg one @  bedtime  Avoid mint, menthol and chocolate. Sips of water instead of throat clearing. Sugar free jolly ranchers for throat  Soothing. Delsym 1 teaspoon every 12 hours for cough. Work on losing weight, as you are deconditioned. Work on exercising more. Follow up with Dr. Melvyn Novas in 3 weeks as is scheduled with  CXR prior to appointment. Please contact office for sooner follow up if symptoms do not improve or worsen or seek emergency care      Magdalen Spatz, NP 03/13/2016  10:48 PM

## 2016-03-13 NOTE — Assessment & Plan Note (Addendum)
New right upper lobe opacity extending to right apex. Cough with low grade fever. Concern for aspiration per wife's history of getting choked on food. Concern for neoplastic disease with smoking history. Plan: Doxycycline 100 mg twice daily x 1 week. CXR today. We will schedule you for a swallow study. Follow the GERD diet. Continue Pantoprazole (protonix) 40 mg   Take  30-60 min before first meal of the day and Pepcid (famotidine)  20 mg one @  bedtime  Avoid mint, menthol and chocolate. Sips of water instead of throat clearing. Sugar free jolly ranchers for throat  Soothing. Delsym 1 teaspoon every 12 hours for cough. Work on losing weight, as you are deconditioned. Work on exercising more. Follow up with Dr. Melvyn Novas in 3 weeks as is scheduled with CXR prior to appointment. If CXR does not clear, consider CT Chest. Please contact office for sooner follow up if symptoms do not improve or worsen or seek emergency care

## 2016-03-13 NOTE — Patient Instructions (Addendum)
It is nice to meet you today. We will send in a prescription for a z-pack today. CXR today. We will schedule you for a swallow study. Follow the GERD diet. Continue Pantoprazole (protonix) 40 mg   Take  30-60 min before first meal of the day and Pepcid (famotidine)  20 mg one @  bedtime  Avoid mint, menthol and chocolate. Sips of water instead of throat clearing. Sugar free jolly ranchers for throat  Soothing. Delsym 1 teaspoon every 12 hours for cough. Work on losing weight, as you are deconditioned. Work on exercising more. Follow up with Dr. Melvyn Novas in 3 weeks as is scheduled. Please contact office for sooner follow up if symptoms do not improve or worsen or seek emergency care

## 2016-03-13 NOTE — Telephone Encounter (Signed)
Please call Mr. Delair and let him know his CXR indicated pneumonia. Tell him to take the Doxycycline as prescribed and follow up with Dr. Melvyn Novas as scheduled with CXR prior. Thanks so much

## 2016-03-13 NOTE — Assessment & Plan Note (Addendum)
Difficulty Swallowing Liquids Low Grade Fever Plan: We will send in a prescription for a Doxycycline 100 mg twice daily x 7 days CXR today. We will schedule you for a swallow evaluation Follow up with Dr. Melvyn Novas as is scheduled . Please contact office for sooner follow up if symptoms do not improve or worsen or seek emergency care .

## 2016-03-13 NOTE — Assessment & Plan Note (Signed)
Continued Chronic cough Non-Compliant with GERD diet Plan: Follow the GERD diet. Continue Pantoprazole (protonix) 40 mg   Take  30-60 min before first meal of the day and Pepcid (famotidine)  20 mg one @  bedtime  Avoid mint, menthol and chocolate. Sips of water instead of throat clearing. Sugar free jolly ranchers for throat  Soothing. Delsym 1 teaspoon every 12 hours for cough. Work on losing weight, as you are deconditioned. Work on exercising more. Follow up with Dr. Melvyn Novas in 3 weeks as is scheduled. Please contact office for sooner follow up if symptoms do not improve or worsen or seek emergency care

## 2016-03-14 NOTE — Progress Notes (Signed)
Called spoke with pt's wife. Reviewed results and recs. Pt rx was changed to doxycycline at ov and was sent into the pharmacy at [facility] on 03/13/16. She states he started medication last night. She voiced understanding and had no further questions. Nothing further needed at this time.

## 2016-03-14 NOTE — Telephone Encounter (Signed)
Called spoke with pt's wife. Reviewed results and recs. Pt's wife voiced understanding and had no further questions.

## 2016-03-18 ENCOUNTER — Telehealth: Payer: Self-pay | Admitting: Acute Care

## 2016-03-18 NOTE — Telephone Encounter (Signed)
Please have this patient scheduled for a barium swallow. The referral send last week was sent back by speech therapy.Thanks so much.

## 2016-03-19 ENCOUNTER — Telehealth: Payer: Self-pay | Admitting: Internal Medicine

## 2016-03-19 DIAGNOSIS — R1310 Dysphagia, unspecified: Secondary | ICD-10-CM

## 2016-03-19 NOTE — Telephone Encounter (Signed)
Spoke with pt's wife and she was wanting to know if swallowing study has been scheduled.  See phone note 03/18/16.  I placed order for MBS and instructed pt's wife they would receive call regarding scheduling.

## 2016-03-19 NOTE — Telephone Encounter (Signed)
Order placed for barium swallow.  Nothing further needed.

## 2016-03-20 ENCOUNTER — Other Ambulatory Visit (HOSPITAL_COMMUNITY): Payer: Self-pay | Admitting: Acute Care

## 2016-03-20 DIAGNOSIS — R1319 Other dysphagia: Secondary | ICD-10-CM

## 2016-03-22 ENCOUNTER — Encounter: Payer: Self-pay | Admitting: Internal Medicine

## 2016-03-22 ENCOUNTER — Other Ambulatory Visit (INDEPENDENT_AMBULATORY_CARE_PROVIDER_SITE_OTHER): Payer: Medicare Other

## 2016-03-22 ENCOUNTER — Ambulatory Visit (INDEPENDENT_AMBULATORY_CARE_PROVIDER_SITE_OTHER): Payer: Medicare Other | Admitting: Internal Medicine

## 2016-03-22 VITALS — BP 112/60 | HR 72 | Temp 98.2°F | Ht 68.0 in | Wt 237.4 lb

## 2016-03-22 DIAGNOSIS — R05 Cough: Secondary | ICD-10-CM

## 2016-03-22 DIAGNOSIS — J181 Lobar pneumonia, unspecified organism: Secondary | ICD-10-CM

## 2016-03-22 DIAGNOSIS — R053 Chronic cough: Secondary | ICD-10-CM

## 2016-03-22 DIAGNOSIS — J189 Pneumonia, unspecified organism: Secondary | ICD-10-CM

## 2016-03-22 LAB — BASIC METABOLIC PANEL
BUN: 13 mg/dL (ref 6–23)
CHLORIDE: 103 meq/L (ref 96–112)
CO2: 27 mEq/L (ref 19–32)
CREATININE: 0.99 mg/dL (ref 0.40–1.50)
Calcium: 9.5 mg/dL (ref 8.4–10.5)
GFR: 78.51 mL/min (ref >60.00)
Glucose, Bld: 147 mg/dL — ABNORMAL HIGH (ref 70–99)
POTASSIUM: 4.4 meq/L (ref 3.5–5.1)
Sodium: 137 mEq/L (ref 135–145)

## 2016-03-22 LAB — CBC WITH DIFFERENTIAL/PLATELET
BASOS ABS: 0 10*3/uL (ref 0.0–0.1)
BASOS PCT: 0.3 % (ref 0.0–3.0)
EOS ABS: 0.2 10*3/uL (ref 0.0–0.7)
Eosinophils Relative: 2.3 % (ref 0.0–5.0)
HEMATOCRIT: 44.2 % (ref 39.0–52.0)
HEMOGLOBIN: 15.1 g/dL (ref 13.0–17.0)
Lymphocytes Relative: 19.3 % (ref 12.0–46.0)
Lymphs Abs: 1.4 10*3/uL (ref 0.7–4.0)
MCHC: 34 g/dL (ref 30.0–36.0)
MCV: 94.6 fl (ref 78.0–100.0)
MONOS PCT: 8.7 % (ref 3.0–12.0)
Monocytes Absolute: 0.6 10*3/uL (ref 0.1–1.0)
Neutro Abs: 5 10*3/uL (ref 1.4–7.7)
Neutrophils Relative %: 69.4 % (ref 43.0–77.0)
Platelets: 215 10*3/uL (ref 150.0–400.0)
RBC: 4.67 Mil/uL (ref 4.22–5.81)
RDW: 13.6 % (ref 11.5–15.5)
WBC: 7.2 10*3/uL (ref 4.0–10.5)

## 2016-03-22 LAB — SEDIMENTATION RATE: SED RATE: 16 mm/h (ref 0–20)

## 2016-03-22 NOTE — Patient Instructions (Addendum)
Please see patient coordinator before you leave today  to schedule CT chest  and sinus CT same day as your swallowing study already scheduled  For cough >  Delsym is 2 tsp every 12 hours as need and use the flutter valve as much as possible  In meantime use duoneb up to every 4 hours as needed   Please remember to go to the lab   department downstairs for your tests - we will call you with the results when they are available.  Please schedule a follow up office visit in 4 weeks, sooner if needed

## 2016-03-22 NOTE — Progress Notes (Signed)
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  Note pt had nl pfts 07/04/15    History of Present Illness  05/03/2015 1st [facility] office visit/ Lorianna Spadaccini   Chief Complaint  Patient presents with  . Advice Only    self referral-pt recently hospitalized at [facility] for pna, was told to establish with Pulm for b/l plaques on lower lobes.    developed chronic min prod  Cough x 1-2 tsp clear mucus x around a decade prior to OV   esp in am's p stirring and some better rest the day but never really gone assoc with doe x parking lot or two flights Around Apr 11 2015  much more cough more production yellow mucus/ low grade admitted [facility] 11/6-11/16 for CAP cough is some better, fever gone energy not better and sleepy a lot more than usual despite use of cpap and has not notified Dr [name] and wishes a second opinion. 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 rec Do not drive a truck if you are sleepy at all  Pantoprazole (protonix) 40 mg   Take  30-60 min before first meal of the day and Pepcid (famotidine)  20 mg one @  bedtime until return to office - this is the best way to tell whether stomach acid is contributing to your problem.   GERD  Diet         07/04/2015  f/u ov/Avya Flavell re: cough/ osa  Chief Complaint  Patient presents with  . Follow-up    PFT and CXR done today. Pt c/o SOB for the past wk- walking "not very far". He also has some prod cough with clear sputum. He has been using neb 1-2 x per day on average for the past wk.    he states he was feeling much better until he caught a cold over the last week but feels his symptoms are well  controlled with the use of albuterol. rec mucinex dm up to 1200 mg every 12 hours as needed for cough  GERD  Work on weight loss     03/13/16 NP eval [name] re flare of chronic cough ? CAP  rec  CXR > ? pna > rx doxy  We will schedule you for a swallow study. Follow the GERD diet. Continue Pantoprazole (protonix) 40 mg   Take  30-60 min before first meal of the day and Pepcid (famotidine)  20 mg one @  bedtime  Avoid mint, menthol and chocolate. Sips of water instead of throat clearing. Sugar free jolly ranchers for throat  Soothing. Delsym 1 teaspoon every 12 hours for cough.  CXR 03/13/2016:>> New opacity in the right upper lobe extending to the right apex. This is likely pneumonia. Neoplastic disease is possible.  Swallow Evaluation>> pending    03/22/2016  f/u ov/Arlissa Monteverde re: cough flare ? ILD? Chief Complaint  Patient presents with  . Follow-up    Cough had improved on abx and then worsened again once he finished med. He is still wheezing and having SOB.    hx is cough x  10 years, much worse toward end of September 2017 low grade fever, clear mucus Nebulizer helps some but hasn't used in several weeks   Cough really only improved while on cough suppression  No obvious day to day or daytime variability or assoc excess/ purulent sputum or mucus plugs or hemoptysis or cp or chest tightness, subjective wheeze or overt sinus or hb symptoms. No unusual exp hx or h/o childhood pna/ asthma or knowledge of premature birth.  Sleeping ok without nocturnal  or early am exacerbation  of respiratory  c/o's or need for noct saba. Also denies any obvious fluctuation of symptoms with weather or environmental changes or other aggravating or alleviating factors except as outlined above   Current Medications, Allergies, Complete Past Medical History, Past Surgical History, Family History, and Social History were reviewed in [employer] record.  ROS  The following are not  active complaints unless bolded sore throat, dysphagia, dental problems, itching, sneezing,  nasal congestion or excess/ purulent secretions, ear ache,   fever, chills, sweats, unintended wt loss, classically pleuritic or exertional cp,  orthopnea pnd or leg swelling, presyncope, palpitations, abdominal pain, anorexia, nausea, vomiting, diarrhea  or change in bowel or bladder habits, change in stools or urine, dysuria,hematuria,  rash, arthralgias, visual complaints, headache, numbness, weakness or ataxia or problems with walking or coordination,  change in mood/affect or memory.                       Objective:   Physical Exam  amb wm nad with freq throat clearing    Wt Readings from Last 3 Encounters:  03/22/16 237 lb 6.4 oz (107.7 kg)  03/13/16 238 lb (108 kg)  01/01/16 232 lb 3.2 oz (105.3 kg)    Vital signs reviewed - Note on arrival 02 sats  95% on RA     HEENT: nl dentition, turbinates, and oropharynx. Nl external ear canals without cough reflex   NECK :  without JVD/Nodes/TM/ nl carotid upstrokes bilaterally   LUNGS: no acc muscle use, clear to A and P bilaterally without cough on insp or exp maneuvers   CV:  RRR  no s3 or murmur or increase in P2, no edema   ABD:  soft and nontender with nl excursion in the supine position. No bruits or organomegaly, bowel sounds nl  MS:  warm without deformities, calf tenderness, cyanosis or clubbing  SKIN: warm and dry without lesions    NEURO:  alert, approp, no deficits      Labs ordered/ reviewed:     Chemistry      Component Value Date/Time   NA 137 03/22/2016 1504   K 4.4 03/22/2016 1504   CL 103 03/22/2016 1504   CO2 27 03/22/2016 1504   BUN 13 03/22/2016 1504   CREATININE 0.99 03/22/2016 1504      Component Value Date/Time   CALCIUM 9.5 03/22/2016 1504        Lab Results  Component Value Date   WBC 7.2 03/22/2016   HGB 15.1 03/22/2016   HCT 44.2 03/22/2016   MCV 94.6 03/22/2016   PLT 215.0  03/22/2016       Lab Results  Component Value Date   TSH 1.44 01/01/2016          Lab Results  Component Value Date   ESRSEDRATE 16 03/22/2016              Assessment & Plan:

## 2016-03-24 NOTE — Assessment & Plan Note (Signed)
Complicated by dm and osa and ERV 46% on pfts 07/04/2015   Body mass index is 36.1   Lab Results  Component Value Date   TSH 1.44 01/01/2016     Contributing to gerd tendency/ doe/reviewed the need and the process to achieve and maintain neg calorie balance > defer f/u primary care including intermittently monitoring thyroid status

## 2016-03-24 NOTE — Assessment & Plan Note (Signed)
cxr is non-specific but worrisome for atx RUL posteriorly c/w bronchogenic ca  so rec CT chest next rather than more abx

## 2016-03-24 NOTE — Assessment & Plan Note (Addendum)
-   onset 2007 - PFT's  07/04/2015  FEV1 2/35 (76 % ) ratio 77  p 2 % improvement from saba with DLCO  73 % corrects to 101 % for alv volume   - Allergy profile 03/22/2016 >  Eos 0.2 /  IgE    The most common causes of chronic cough in immunocompetent adults include the following: upper airway cough syndrome (UACS), previously referred to as postnasal drip syndrome (PNDS), which is caused by variety of rhinosinus conditions; (2) asthma; (3) GERD; (4) chronic bronchitis from cigarette smoking or other inhaled environmental irritants; (5) nonasthmatic eosinophilic bronchitis; and (6) bronchiectasis.   These conditions, singly or in combination, have accounted for up to 94% of the causes of chronic cough in prospective studies.   Other conditions have constituted no >6% of the causes in prospective studies These have included bronchogenic carcinoma, chronic interstitial pneumonia, sarcoidosis, left ventricular failure, ACEI-induced cough, and aspiration from a condition associated with pharyngeal dysfunction.    Chronic cough is often simultaneously caused by more than one condition. A single cause has been found from 38 to 82% of the time, multiple causes from 18 to 62%. Multiply caused cough has been the result of three diseases up to 42% of the time.       For now will keep ddx broad ? Lung ca ?   aspiration/ LPR > swallowing eval/ CT chest and sinus CT while max rx for gerd/ prn duoneb and sort out as data comes back in but not further abx    I had an extended discussion with the patient reviewing all relevant studies completed to date and  lasting 15 to 20 minutes of a 25 minute visit    Each maintenance medication was reviewed in detail including most importantly the difference between maintenance and prns and under what circumstances the prns are to be triggered using an action plan format that is not reflected in the computer generated alphabetically organized AVS.    Please see instructions  for details which were reviewed in writing and the patient given a copy highlighting the part that I personally wrote and discussed at today's ov.

## 2016-03-25 LAB — RESPIRATORY ALLERGY PROFILE REGION II ~~LOC~~
Allergen, A. alternata, m6: <0.1 kU/L
Allergen, Cedar tree, t12: <0.1 kU/L
Allergen, D pternoyssinus,d7: 0.27 kU/L — ABNORMAL HIGH
Allergen, Oak,t7: <0.1 kU/L
Allergen, P. notatum, m1: <0.1 kU/L
Aspergillus fumigatus, m3: <0.1 kU/L
Box Elder IgE: <0.1 kU/L
Cat Dander: 0.19 kU/L — ABNORMAL HIGH
Cockroach: <0.1 kU/L
Common Ragweed: <0.1 kU/L
D. farinae: 0.2 kU/L — ABNORMAL HIGH
DOG DANDER: 0.21 kU/L — AB
IGE (IMMUNOGLOBULIN E), SERUM: 17 kU/L (ref <115)
Johnson Grass: <0.1 kU/L
Pecan/Hickory Tree IgE: <0.1 kU/L
Rough Pigweed  IgE: <0.1 kU/L
Sheep Sorrel IgE: <0.1 kU/L
Timothy Grass: <0.1 kU/L

## 2016-03-26 NOTE — Progress Notes (Signed)
Spoke with pt and notified of results per Dr. Wert. Pt verbalized understanding and denied any questions. 

## 2016-03-27 ENCOUNTER — Ambulatory Visit (HOSPITAL_COMMUNITY)
Admission: RE | Admit: 2016-03-27 | Discharge: 2016-03-27 | Disposition: A | Discharge status: Home / Self Care | Payer: Medicare Other | Source: Ambulatory Visit | Attending: Acute Care | Admitting: Acute Care

## 2016-03-27 ENCOUNTER — Ambulatory Visit (HOSPITAL_COMMUNITY)
Admission: RE | Admit: 2016-03-27 | Discharge: 2016-03-27 | Disposition: A | Discharge status: Home / Self Care | Payer: Medicare Other | Source: Ambulatory Visit | Attending: Internal Medicine | Admitting: Internal Medicine

## 2016-03-27 ENCOUNTER — Telehealth: Payer: Self-pay | Admitting: Acute Care

## 2016-03-27 ENCOUNTER — Encounter (HOSPITAL_COMMUNITY): Payer: Self-pay

## 2016-03-27 DIAGNOSIS — I700 Atherosclerosis of aorta: Secondary | ICD-10-CM | POA: Diagnosis not present

## 2016-03-27 DIAGNOSIS — J929 Pleural plaque without asbestos: Secondary | ICD-10-CM | POA: Diagnosis not present

## 2016-03-27 DIAGNOSIS — R918 Other nonspecific abnormal finding of lung field: Secondary | ICD-10-CM | POA: Diagnosis not present

## 2016-03-27 DIAGNOSIS — R1319 Other dysphagia: Secondary | ICD-10-CM

## 2016-03-27 DIAGNOSIS — R590 Localized enlarged lymph nodes: Secondary | ICD-10-CM | POA: Diagnosis not present

## 2016-03-27 DIAGNOSIS — R1310 Dysphagia, unspecified: Secondary | ICD-10-CM | POA: Diagnosis present

## 2016-03-27 DIAGNOSIS — R05 Cough: Secondary | ICD-10-CM | POA: Diagnosis not present

## 2016-03-27 DIAGNOSIS — R053 Chronic cough: Secondary | ICD-10-CM

## 2016-03-27 MED ORDER — IOPAMIDOL (ISOVUE-300) INJECTION 61%
INTRAVENOUS | Status: AC
  Administered 2016-03-27: 75 mL
  Filled 2016-03-27: qty 75

## 2016-03-27 NOTE — Telephone Encounter (Signed)
Note opened in error.

## 2016-03-28 NOTE — Progress Notes (Signed)
Spoke with pt and notified of results per Dr. Wert. Pt verbalized understanding and denied any questions. 

## 2016-04-02 ENCOUNTER — Ambulatory Visit: Payer: Medicare Other | Admitting: Internal Medicine

## 2016-04-04 ENCOUNTER — Telehealth: Payer: Self-pay | Admitting: Internal Medicine

## 2016-04-04 ENCOUNTER — Encounter: Payer: Self-pay | Admitting: Internal Medicine

## 2016-04-04 DIAGNOSIS — R918 Other nonspecific abnormal finding of lung field: Secondary | ICD-10-CM | POA: Insufficient documentation

## 2016-04-04 NOTE — Telephone Encounter (Signed)
Set up for 04/11/16

## 2016-04-04 NOTE — Telephone Encounter (Signed)
Spoke with pt's wife. They are now back in town and want to set up the pt's bronch. Advised her that I would get this message to MW to set up.  MW - please advise. Thanks.

## 2016-04-11 ENCOUNTER — Encounter (HOSPITAL_COMMUNITY): Payer: Self-pay | Admitting: Respiratory Therapy

## 2016-04-11 ENCOUNTER — Encounter (HOSPITAL_COMMUNITY)
Admission: RE | Disposition: A | Discharge status: Home / Self Care | Payer: Self-pay | Source: Ambulatory Visit | Attending: Internal Medicine

## 2016-04-11 ENCOUNTER — Ambulatory Visit (HOSPITAL_COMMUNITY)
Admission: RE | Admit: 2016-04-11 | Discharge: 2016-04-11 | Disposition: A | Discharge status: Home / Self Care | Payer: Medicare Other | Source: Ambulatory Visit | Attending: Internal Medicine | Admitting: Internal Medicine

## 2016-04-11 DIAGNOSIS — Z79899 Other long term (current) drug therapy: Secondary | ICD-10-CM | POA: Insufficient documentation

## 2016-04-11 DIAGNOSIS — K219 Gastro-esophageal reflux disease without esophagitis: Secondary | ICD-10-CM | POA: Diagnosis not present

## 2016-04-11 DIAGNOSIS — C3411 Malignant neoplasm of upper lobe, right bronchus or lung: Secondary | ICD-10-CM | POA: Diagnosis not present

## 2016-04-11 DIAGNOSIS — Z87891 Personal history of nicotine dependence: Secondary | ICD-10-CM | POA: Insufficient documentation

## 2016-04-11 DIAGNOSIS — R918 Other nonspecific abnormal finding of lung field: Secondary | ICD-10-CM

## 2016-04-11 DIAGNOSIS — C3490 Malignant neoplasm of unspecified part of unspecified bronchus or lung: Secondary | ICD-10-CM | POA: Diagnosis present

## 2016-04-11 DIAGNOSIS — Z7984 Long term (current) use of oral hypoglycemic drugs: Secondary | ICD-10-CM | POA: Insufficient documentation

## 2016-04-11 DIAGNOSIS — G4733 Obstructive sleep apnea (adult) (pediatric): Secondary | ICD-10-CM | POA: Diagnosis not present

## 2016-04-11 DIAGNOSIS — Z6836 Body mass index (BMI) 36.0-36.9, adult: Secondary | ICD-10-CM | POA: Insufficient documentation

## 2016-04-11 DIAGNOSIS — E119 Type 2 diabetes mellitus without complications: Secondary | ICD-10-CM | POA: Diagnosis not present

## 2016-04-11 HISTORY — PX: VIDEO BRONCHOSCOPY: SHX5072

## 2016-04-11 LAB — GLUCOSE, CAPILLARY: GLUCOSE-CAPILLARY: 131 mg/dL — AB (ref 65–99)

## 2016-04-11 SURGERY — VIDEO BRONCHOSCOPY WITHOUT FLUORO
Anesthesia: Moderate Sedation | Laterality: Bilateral

## 2016-04-11 MED ORDER — MEPERIDINE HCL 100 MG/ML IJ SOLN
INTRAMUSCULAR | Status: AC
  Filled 2016-04-11: qty 2

## 2016-04-11 MED ORDER — PHENYLEPHRINE HCL 0.25 % NA SOLN
NASAL | Status: DC | PRN
  Administered 2016-04-11: 2 via NASAL

## 2016-04-11 MED ORDER — PHENYLEPHRINE HCL 0.25 % NA SOLN: 1.0000 | Freq: Four times a day (QID) | NASAL | Status: DC | PRN

## 2016-04-11 MED ORDER — MIDAZOLAM HCL 5 MG/ML IJ SOLN: 1.0000 mg | Freq: Once | INTRAMUSCULAR | Status: DC

## 2016-04-11 MED ORDER — MEPERIDINE HCL 100 MG/ML IJ SOLN: 100.0000 mg | Freq: Once | INTRAMUSCULAR | Status: DC

## 2016-04-11 MED ORDER — MEPERIDINE HCL 25 MG/ML IJ SOLN
INTRAMUSCULAR | Status: DC | PRN
  Administered 2016-04-11: 50 mg via INTRAVENOUS

## 2016-04-11 MED ORDER — SODIUM CHLORIDE 0.9 % IV SOLN
INTRAVENOUS | Status: DC
  Administered 2016-04-11: 08:00:00 via INTRAVENOUS

## 2016-04-11 MED ORDER — LIDOCAINE HCL 2 % EX GEL: 1.0000 "application " | Freq: Once | CUTANEOUS | Status: DC

## 2016-04-11 MED ORDER — MIDAZOLAM HCL 10 MG/2ML IJ SOLN
INTRAMUSCULAR | Status: DC | PRN
  Administered 2016-04-11 (×2): 2.5 mg via INTRAVENOUS

## 2016-04-11 MED ORDER — LIDOCAINE HCL 2 % EX GEL
CUTANEOUS | Status: DC | PRN
  Administered 2016-04-11: 2

## 2016-04-11 MED ORDER — MIDAZOLAM HCL 5 MG/ML IJ SOLN
INTRAMUSCULAR | Status: AC
  Filled 2016-04-11: qty 2

## 2016-04-11 MED ORDER — EPINEPHRINE PF 1 MG/10ML IJ SOSY
PREFILLED_SYRINGE | INTRAMUSCULAR | Status: DC | PRN
  Administered 2016-04-11: 1 via INTRAVENOUS

## 2016-04-11 MED ORDER — LIDOCAINE HCL 1 % IJ SOLN
INTRAMUSCULAR | Status: DC | PRN
  Administered 2016-04-11: 6 mL via RESPIRATORY_TRACT

## 2016-04-11 NOTE — H&P (Signed)
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  Note pt had nl pfts 07/04/15    History of Present Illness  05/03/2015 1st [facility] office visit/ Wert       Chief Complaint  Patient presents with  . Advice Only    self referral-pt recently hospitalized at [facility] for pna, was told to establish with Pulm for b/l plaques on lower lobes.    developed chronic min prod  Cough x 1-2 tsp clear mucus x around a decade prior to OV   esp in am's p stirring and some better rest the day but never really gone assoc with doe x parking lot or two flights Around Apr 11 2015  much more cough more production yellow mucus/ low grade admitted [facility] 11/6-11/16 for CAP cough is some better, fever gone energy not better and sleepy a lot more than usual despite use of cpap and has not notified Dr [name] and wishes a second opinion. 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 rec Do not drive a truck if you are sleepy at all  Pantoprazole (protonix) 40 mg   Take  30-60 min before first meal of the day and Pepcid (famotidine)  20 mg one @  bedtime until return to office - this is the best way to tell whether stomach acid is contributing to your problem.   GERD  Diet         07/04/2015  f/u ov/Wert re: cough/ osa      Chief Complaint  Patient presents with  . Follow-up    PFT and CXR done today. Pt c/o SOB for the past wk- walking "not very far". He also has some prod cough with clear sputum. He has been using neb 1-2 x per day on average for the past wk.    he states he was feeling much better until he caught a cold over the last week but feels his symptoms are well controlled with the use of albuterol. rec mucinex dm up to 1200 mg every 12  hours as needed for cough  GERD  Work on weight loss     03/13/16 NP eval [name] re flare of chronic cough ? CAP  rec  CXR > ? pna > rx doxy  We will schedule you for a swallow study. Follow the GERD diet. Continue Pantoprazole (protonix) 40 mg Take 30-60 min before first meal of the day and Pepcid (famotidine) 20 mg one @ bedtime  Avoid mint, menthol and chocolate. Sips of water instead of throat clearing. Sugar free jolly ranchers for throat  Soothing. Delsym 1 teaspoon every 12 hours for cough.  CXR 03/13/2016:>> New opacity in the right upper lobe extending to the right apex. This is likely pneumonia. Neoplastic disease is possible.  Swallow Evaluation>> pending    03/22/2016  f/u ov/Wert re: cough flare ? ILD?     Chief Complaint  Patient presents with  . Follow-up    Cough had improved on abx and then worsened again once he finished med. He is still wheezing and having SOB.    hx is cough x 10 years, much worse toward end of September 2017 low grade fever, clear mucus Nebulizer helps some but hasn't used  in several weeks   Cough really only improved while on cough suppression  No obvious day to day or daytime variability or assoc excess/ purulent sputum or mucus plugs or hemoptysis or cp or chest tightness, subjective wheeze or overt sinus or hb symptoms. No unusual exp hx or h/o childhood pna/ asthma or knowledge of premature birth.  Sleeping ok without nocturnal  or early am exacerbation  of respiratory  c/o's or need for noct saba. Also denies any obvious fluctuation of symptoms with weather or environmental changes or other aggravating or alleviating factors except as outlined above   Current Medications, Allergies, Complete Past Medical History, Past Surgical History, Family History, and Social History were reviewed in [employer] record.  ROS  The following are not active complaints unless bolded sore throat, dysphagia, dental  problems, itching, sneezing,  nasal congestion or excess/ purulent secretions, ear ache,   fever, chills, sweats, unintended wt loss, classically pleuritic or exertional cp,  orthopnea pnd or leg swelling, presyncope, palpitations, abdominal pain, anorexia, nausea, vomiting, diarrhea  or change in bowel or bladder habits, change in stools or urine, dysuria,hematuria,  rash, arthralgias, visual complaints, headache, numbness, weakness or ataxia or problems with walking or coordination,  change in mood/affect or memory.                       Objective:   Physical Exam  amb wm nad with freq throat clearing       Wt Readings from Last 3 Encounters:  03/22/16 237 lb 6.4 oz (107.7 kg)  03/13/16 238 lb (108 kg)  01/01/16 232 lb 3.2 oz (105.3 kg)    Vital signs reviewed - Note on arrival 02 sats  95% on RA     HEENT: nl dentition, turbinates, and oropharynx. Nl external ear canals without cough reflex   NECK :  without JVD/Nodes/TM/ nl carotid upstrokes bilaterally   LUNGS: no acc muscle use, clear to A and P bilaterally without cough on insp or exp maneuvers   CV:  RRR  no s3 or murmur or increase in P2, no edema   ABD:  soft and nontender with nl excursion in the supine position. No bruits or organomegaly, bowel sounds nl  MS:  warm without deformities, calf tenderness, cyanosis or clubbing  SKIN: warm and dry without lesions    NEURO:  alert, approp, no deficits      Labs ordered/ reviewed:     Chemistry           Component Value Date/Time   NA 137 03/22/2016 1504   K 4.4 03/22/2016 1504   CL 103 03/22/2016 1504   CO2 27 03/22/2016 1504   BUN 13 03/22/2016 1504   CREATININE 0.99 03/22/2016 1504           Component Value Date/Time   CALCIUM 9.5 03/22/2016 1504             Lab Results  Component Value Date   WBC 7.2 03/22/2016   HGB 15.1 03/22/2016   HCT 44.2 03/22/2016   MCV 94.6 03/22/2016   PLT 215.0  03/22/2016            Lab Results  Component Value Date   TSH 1.44 01/01/2016               Lab Results  Component Value Date   ESRSEDRATE 16 03/22/2016              Assessment &  Plan:      Assessment & Plan Note by Tanda Rockers, MD at 03/24/2016 4:02 PM   Author: Tanda Rockers, MD Author Type: Physician Filed: 03/24/2016 4:02 PM  Note Status: Written Cosign: Cosign Not Required Encounter Date: 03/22/2016  Problem: Morbid obesity [facility])  Editor: Tanda Rockers, MD (Physician)    Complicated by dm and osa and ERV 46% on pfts 07/04/2015   Body mass index is 36.1        Lab Results  Component Value Date   TSH 1.44 01/01/2016     Contributing to gerd tendency/ doe/reviewed the need and the process to achieve and maintain neg calorie balance > defer f/u primary care including intermittently monitoring thyroid status      Assessment & Plan Note by Tanda Rockers, MD at 03/24/2016 3:54 PM   Author: Tanda Rockers, MD Author Type: Physician Filed: 03/24/2016 4:01 PM  Note Status: Bernell List: Cosign Not Required Encounter Date: 03/22/2016  Problem: Chronic cough  Editor: Tanda Rockers, MD (Physician)  Prior Versions: 1. Tanda Rockers, MD (Physician) at 03/24/2016 3:55 PM - Written    - onset 2007 - PFT's  07/04/2015  FEV1 2/35 (76 % ) ratio 77  p 2 % improvement from saba with DLCO  73 % corrects to 101 % for alv volume   - Allergy profile 03/22/2016 >  Eos 0.2 /  IgE    The most common causes of chronic cough in immunocompetent adults include the following: upper airway cough syndrome (UACS), previously referred to as postnasal drip syndrome (PNDS), which is caused by variety of rhinosinus conditions; (2) asthma; (3) GERD; (4) chronic bronchitis from cigarette smoking or other inhaled environmental irritants; (5) nonasthmatic eosinophilic bronchitis; and (6) bronchiectasis.   These conditions, singly or in combination, have accounted for up to  94% of the causes of chronic cough in prospective studies.   Other conditions have constituted no >6% of the causes in prospective studies These have included bronchogenic carcinoma, chronic interstitial pneumonia, sarcoidosis, left ventricular failure, ACEI-induced cough, and aspiration from a condition associated with pharyngeal dysfunction.    Chronic cough is often simultaneously caused by more than one condition. A single cause has been found from 38 to 82% of the time, multiple causes from 18 to 62%. Multiply caused cough has been the result of three diseases up to 42% of the time.       For now will keep ddx broad ? Lung ca ?   aspiration/ LPR > swallowing eval/ CT chest and sinus CT while max rx for gerd/ prn duoneb and sort out as data comes back in but not further abx    I had an extended discussion with the patient reviewing all relevant studies completed to date and  lasting 15 to 20 minutes of a 25 minute visit    Each maintenance medication was reviewed in detail including most importantly the difference between maintenance and prns and under what circumstances the prns are to be triggered using an action plan format that is not reflected in the computer generated alphabetically organized AVS.    Please see instructions for details which were reviewed in writing and the patient given a copy highlighting the part that I personally wrote and discussed at today's ov.     Assessment & Plan Note by Tanda Rockers, MD at 03/24/2016 3:55 PM   Author: Tanda Rockers, MD Author Type: Physician Filed: 03/24/2016 3:59 PM  Note  Status: Written Cosign: Cosign Not Required Encounter Date: 03/22/2016  Problem: CAP (community acquired pneumonia)  Editor: Tanda Rockers, MD (Physician)    cxr is non-specific but worrisome for atx RUL posteriorly c/w bronchogenic ca  so rec CT chest next rather than more abx    Patient Instructions by Tanda Rockers, MD at 03/22/2016 2:00 PM   Author:  Tanda Rockers, MD Author Type: Physician Filed: 03/24/2016 3:58 PM  Note Status: Addendum Cosign: Cosign Not Required Encounter Date: 03/22/2016  Editor: Tanda Rockers, MD (Physician)  Prior Versions: 1. Tanda Rockers, MD (Physician) at 03/24/2016 3:49 PM - Addendum   2. Tanda Rockers, MD (Physician) at 03/22/2016 2:38 PM - Addendum   3. Tanda Rockers, MD (Physician) at 03/22/2016 2:36 PM - Signed            03/27/16  Right upper lobe collapse, likely secondary to a suprahilar mass with endobronchial extension/cut off. There are additional pulmonary nodules within the right middle and left lower lobes, as well as mediastinal and hilar lymphadenopathy. Findings are highly suspicious for metastatic bronchogenic carcinoma. Tissue sampling via bronchoscopy recommended. PET-CT may be helpful for further staging. 2. 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 No typical findings of mesothelioma.  Discussed by phone  in detail all the  indications, usual  risks and alternatives  relative to the benefits with patient who agrees to proceed with bronchoscopy with biopsy. 04/11/2016    04/11/2016 day of FOB no change in hx or exam      Christinia Gully, MD Pulmonary and [place] [phone] After 5:30 PM or weekends, call [phone]

## 2016-04-11 NOTE — Discharge Instructions (Signed)
Flexible Bronchoscopy, Care After These instructions give you information on caring for yourself after your procedure. Your doctor may also give you more specific instructions. Call your doctor if you have any problems or questions after your procedure. HOME CARE  Do not eat or drink anything for 2 hours after your procedure. If you try to eat or drink before the medicine wears off, food or drink could go into your lungs. You could also burn yourself.  After 2 hours have passed and when you can cough and gag normally, you may eat soft food and drink liquids slowly.  The day after the test, you may eat your normal diet.  You may do your normal activities.  Keep all doctor visits. GET HELP RIGHT AWAY IF:  You get more and more short of breath.  You get light-headed.  You feel like you are going to pass out (faint).  You have chest pain.  You have new problems that worry you.  You cough up more than a little blood.  You cough up more blood than before. MAKE SURE YOU:  Understand these instructions.  Will watch your condition.  Will get help right away if you are not doing well or get worse.   This information is not intended to replace advice given to you by your health care provider. Make sure you discuss any questions you have with your health care provider.   Document Released: 03/24/2009 Document Revised: 06/01/2013 Document Reviewed: 01/29/2013 Elsevier Interactive Patient Education 2016 [employer].  Nothing to eat or drink until    10:15 am today 04/11/2016

## 2016-04-11 NOTE — Op Note (Signed)
Bronchoscopy Procedure Note  Date of Operation: 04/11/2016   Pre-op Diagnosis: lung ca  Post-op Diagnosis: same  Surgeon: Christinia Gully  Anesthesia: Monitored Local Anesthesia with Sedation Time Started: 815 am  Time Stopped:  840 am  Operation: Video Flexible fiberoptic bronchoscopy, diagnostic   Findings: exophytic mass completely obst RUL very friable bloody   Specimen: bronchial washings RUL/ endobronchial bx x 2 (limited by bleeding)   Estimated Blood Loss: 25 cc controlled with epi   Complications: bleeding from tumor even with lavage  Indications and History: See updated H and P same date. The risks, benefits, complications, treatment options and expected outcomes were discussed with the patient.  The possibilities of reaction to medication, pulmonary aspiration, perforation of a viscus, bleeding, failure to diagnose a condition and creating a complication requiring transfusion or operation were discussed with the patient who freely signed the consent.    Description of Procedure: The patient was re-examined in the bronchoscopy suite and the site of surgery properly noted/marked.  The patient was identified  and the procedure verified as Flexible Fiberoptic Bronchoscopy.  A Time Out was held and the above information confirmed.   After the induction of topical nasopharyngeal anesthesia, the patient was positioned  and the bronchoscope was passed through the R  naris. The vocal cords were visualized and  1% buffered lidocaine 5 ml was topically placed onto the cords. The cords were nl. The scope was then passed into the trachea.  1% buffered lidocaine given topically. Airways inspected bilaterally to the subsegmental level with the following findings:  Nl airways except for exophytic mass completely obst RUL very friable bloody mucosa / bled just with gentle lavage / controlled with epi x one amp  endobronchially   Interventions:  Bronchial washings rul Endobronchial bx x 2  RUL orifice     The Patient was taken to the Endoscopy Recovery area in satisfactory condition.  Attestation: I performed the procedure.  Christinia Gully, MD Pulmonary and [place] [phone] After 5:30 PM or weekends, call [phone]

## 2016-04-11 NOTE — Progress Notes (Signed)
Video Bronchoscopy done  Intervention bronchial washing done Intervention bronchial biopsy done

## 2016-04-12 ENCOUNTER — Encounter (HOSPITAL_COMMUNITY): Payer: Self-pay | Admitting: Internal Medicine

## 2016-04-15 ENCOUNTER — Telehealth: Payer: Self-pay | Admitting: Internal Medicine

## 2016-04-15 ENCOUNTER — Other Ambulatory Visit: Payer: Self-pay | Admitting: Internal Medicine

## 2016-04-15 DIAGNOSIS — R918 Other nonspecific abnormal finding of lung field: Secondary | ICD-10-CM

## 2016-04-15 NOTE — Telephone Encounter (Signed)
Spoke with patient's wife-this was earlier in the day and they have already spoken with MW and PET scan has been scheduled. Nothing more needed at this time.

## 2016-04-19 ENCOUNTER — Encounter: Payer: Self-pay | Admitting: Internal Medicine

## 2016-04-19 ENCOUNTER — Ambulatory Visit (INDEPENDENT_AMBULATORY_CARE_PROVIDER_SITE_OTHER): Payer: Medicare Other | Admitting: Internal Medicine

## 2016-04-19 ENCOUNTER — Encounter: Payer: Self-pay | Admitting: *Deleted

## 2016-04-19 VITALS — BP 126/64 | HR 80 | Ht 68.0 in | Wt 235.4 lb

## 2016-04-19 DIAGNOSIS — R053 Chronic cough: Secondary | ICD-10-CM

## 2016-04-19 DIAGNOSIS — R05 Cough: Secondary | ICD-10-CM

## 2016-04-19 DIAGNOSIS — R918 Other nonspecific abnormal finding of lung field: Secondary | ICD-10-CM | POA: Diagnosis not present

## 2016-04-19 MED ORDER — MEPERIDINE HCL 50 MG PO TABS: 50.0000 mg | ORAL_TABLET | ORAL | 0 refills | Status: DC | PRN

## 2016-04-19 MED ORDER — PANTOPRAZOLE SODIUM 40 MG PO TBEC: 40.0000 mg | DELAYED_RELEASE_TABLET | Freq: Two times a day (BID) | ORAL | 11 refills | Status: DC

## 2016-04-19 NOTE — Progress Notes (Signed)
Oncology Nurse Navigator Documentation  Oncology Nurse Navigator Flowsheets 04/19/2016  Navigator Encounter Type Other/per cancer conference, requested tissue to be sent for PDL 1 testing.   Treatment Phase Pre-Tx/Tx Discussion  Barriers/Navigation Needs Coordination of Care  Interventions Coordination of Care  Coordination of Care Other  Acuity Level 1  Time Spent with Patient 15

## 2016-04-19 NOTE — Patient Instructions (Addendum)
Protonix 40 mg Take 30- 60 min before your first and last meals of the day   Take delsym two tsp every 12 hours and supplement if needed with  demerol 50 mg up to 1-2 every 4 hours to suppress the urge to cough. Swallowing water or using ice chips/non mint and menthol containing candies (such as lifesavers or sugarless jolly ranchers) are also effective.  You should rest your voice and avoid activities that you know make you cough.  Once you have eliminated the cough for 3 straight days try reducing the demerol first,  then the delsym as tolerated.    Pulmonary follow up is as needed

## 2016-04-19 NOTE — Progress Notes (Signed)
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  Note pt had nl pfts 07/04/15    History of Present Illness  05/03/2015 1st [facility] office visit/ Tim Turner   Chief Complaint  Patient presents with  . Advice Only    self referral-pt recently hospitalized at [facility] for pna, was told to establish with Pulm for b/l plaques on lower lobes.    developed chronic min prod  Cough x 1-2 tsp clear mucus x around a decade prior to OV   esp in am's p stirring and some better rest the day but never really gone assoc with doe x parking lot or two flights Around Apr 11 2015  much more cough more production yellow mucus/ low grade admitted [facility] 11/6-11/16 for CAP cough is some better, fever gone energy not better and sleepy a lot more than usual despite use of cpap and has not notified Dr Tim Turner and wishes a second opinion. 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 rec Do not drive a truck if you are sleepy at all  Pantoprazole (protonix) 40 mg   Take  30-60 min before first meal of the day and Pepcid (famotidine)  20 mg one @  bedtime until return to office - this is the best way to tell whether stomach acid is contributing to your problem.   GERD  Diet         07/04/2015  f/u ov/Tim Turner re: cough/ osa  Chief Complaint  Patient presents with  . Follow-up    PFT and CXR done today. Pt c/o SOB for the past wk- walking "not very far". He also has some prod cough with clear sputum. He has been using neb 1-2 x per day on average for the past wk.    he states he was feeling much better until he caught a cold over the last week but feels his symptoms are well  controlled with the use of albuterol. rec mucinex dm up to 1200 mg every 12 hours as needed for cough  GERD  Work on weight loss  F/u 6 months: did not happen    03/13/16 NP eval Tim Turner re flare of chronic cough ? CAP  rec  CXR > ? pna > rx doxy  We will schedule you for a swallow study. Follow the GERD diet. Continue Pantoprazole (protonix) 40 mg   Take  30-60 min before first meal of the day and Pepcid (famotidine)  20 mg one @  bedtime  Avoid mint, menthol and chocolate. Sips of water instead of throat clearing. Sugar free jolly ranchers for throat  Soothing. Delsym 1 teaspoon every 12 hours for cough.  CXR 03/13/2016:>> New opacity in the right upper lobe extending to the right apex. This is likely pneumonia. Neoplastic disease is possible.  Swallow Evaluation>> pending    03/22/2016  f/u ov/Tim Turner re: cough flare ? ILD? Chief Complaint  Patient presents with  . Follow-up    Cough had improved on abx and then worsened again once he finished med. He is still wheezing and having SOB.  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 Follow-up    Pt. states his breathing has remained unchanged, stilling coughing it has not changed  cough worse in evening p ppi each am but no hemoptysis   No obvious day to day or daytime variability or assoc excess/ purulent sputum or mucus plugs or hemoptysis or cp or chest tightness, subjective wheeze or overt sinus or hb symptoms. No unusual exp hx or h/o childhood pna/ asthma or knowledge of premature birth.  Sleeping ok without nocturnal  or early am exacerbation  of  respiratory  c/o's or need for noct saba. Also denies any obvious fluctuation of symptoms with weather or environmental changes or other aggravating or alleviating factors except as outlined above   Current Medications, Allergies, Complete Past Medical History, Past Surgical History, Family History, and Social History were reviewed in [employer] record.  ROS  The following are not active complaints unless bolded sore throat, dysphagia, dental problems, itching, sneezing,  nasal congestion or excess/ purulent secretions, ear ache,   fever, chills, sweats, unintended wt loss, classically pleuritic or exertional cp,  orthopnea pnd or leg swelling, presyncope, palpitations, abdominal pain, anorexia, nausea, vomiting, diarrhea  or change in bowel or bladder habits, change in stools or urine, dysuria,hematuria,  rash, arthralgias, visual complaints, headache, numbness, weakness or ataxia or problems with walking or coordination,  change in mood/affect or memory.                       Objective:   Physical Exam  amb wm nad harsh barking cough    04/19/2016       235  03/22/16 237 lb 6.4 oz (107.7 kg)  03/13/16 238 lb (108 kg)  01/01/16 232 lb 3.2 oz (105.3 kg)    Vital signs reviewed - Note on arrival 02 sats  95% on RA     HEENT: nl dentition, turbinates, and oropharynx. Nl external ear canals without cough reflex   NECK :  without JVD/Nodes/TM/ nl carotid upstrokes bilaterally   LUNGS: no acc muscle use, clear to A and P bilaterally without cough on insp or exp maneuvers   CV:  RRR  no s3 or murmur or increase in P2, no edema   ABD:  soft and nontender with nl excursion in the supine position. No bruits or organomegaly, bowel sounds nl  MS:  warm without deformities, calf tenderness, cyanosis or clubbing  SKIN: warm and dry without lesions    NEURO:  alert, approp, no deficits                   Assessment & Plan:

## 2016-04-21 ENCOUNTER — Encounter: Payer: Self-pay | Admitting: Internal Medicine

## 2016-04-21 NOTE — Assessment & Plan Note (Signed)
PFT's  07/04/2015  FEV1 2/35 (76 % ) ratio 77  p 2 % improvement from saba with DLCO  73 % corrects to 101 % for alv volume   - Allergy profile 03/22/2016 >  Eos 0.2 /  IgE  17  Pos RAST cat/dog/dust  This cough dates back 10 years and likely multifactorial, much worse obviously in setting of endobronchial involvement with tumor > rx max gerd rx/ demerol since can't take ms or derivatives but tol demerol well for fob  I had an extended discussion with the patient reviewing all relevant studies completed to date and  lasting 15 to 20 minutes of a 25 minute visit    Each maintenance medication was reviewed in detail including most importantly the difference between maintenance and prns and under what circumstances the prns are to be triggered using an action plan format that is not reflected in the computer generated alphabetically organized AVS.    Please see instructions for details which were reviewed in writing and the patient given a copy highlighting the part that I personally wrote and discussed at today's ov.

## 2016-04-21 NOTE — Assessment & Plan Note (Signed)
03/27/16  Right upper lobe collapse, likely secondary to a suprahilar mass with endobronchial extension/cut off. There are additional pulmonary nodules within the right middle and left lower lobes, as well as mediastinal and hilar lymphadenopathy. Findings are highly suspicious for metastatic bronchogenic carcinoma. Tissue sampling via bronchoscopy recommended. PET-CT may be helpful for further staging. 2. 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 No typical findings of mesothelioma. 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

## 2016-04-23 ENCOUNTER — Other Ambulatory Visit: Payer: Self-pay | Admitting: Medical Oncology

## 2016-04-23 ENCOUNTER — Ambulatory Visit
Admission: RE | Admit: 2016-04-23 | Discharge: 2016-04-23 | Disposition: A | Discharge status: Home / Self Care | Payer: Medicare Other | Source: Ambulatory Visit | Attending: Internal Medicine | Admitting: Internal Medicine

## 2016-04-23 DIAGNOSIS — R918 Other nonspecific abnormal finding of lung field: Secondary | ICD-10-CM

## 2016-04-23 DIAGNOSIS — R591 Generalized enlarged lymph nodes: Secondary | ICD-10-CM | POA: Insufficient documentation

## 2016-04-23 DIAGNOSIS — I700 Atherosclerosis of aorta: Secondary | ICD-10-CM | POA: Insufficient documentation

## 2016-04-23 DIAGNOSIS — K5730 Diverticulosis of large intestine without perforation or abscess without bleeding: Secondary | ICD-10-CM | POA: Insufficient documentation

## 2016-04-23 DIAGNOSIS — R911 Solitary pulmonary nodule: Secondary | ICD-10-CM | POA: Insufficient documentation

## 2016-04-23 LAB — GLUCOSE, CAPILLARY: GLUCOSE-CAPILLARY: 135 mg/dL — AB (ref 65–99)

## 2016-04-23 MED ORDER — FLUDEOXYGLUCOSE F - 18 (FDG) INJECTION
12.1900 | Freq: Once | INTRAVENOUS | Status: AC | PRN
  Administered 2016-04-23: 12.19 via INTRAVENOUS

## 2016-04-24 ENCOUNTER — Ambulatory Visit (HOSPITAL_BASED_OUTPATIENT_CLINIC_OR_DEPARTMENT_OTHER): Payer: Medicare Other | Admitting: Internal Medicine

## 2016-04-24 ENCOUNTER — Encounter: Payer: Self-pay | Admitting: Internal Medicine

## 2016-04-24 ENCOUNTER — Telehealth: Payer: Self-pay | Admitting: Oncology

## 2016-04-24 ENCOUNTER — Other Ambulatory Visit (HOSPITAL_BASED_OUTPATIENT_CLINIC_OR_DEPARTMENT_OTHER): Payer: Medicare Other

## 2016-04-24 ENCOUNTER — Encounter: Payer: Self-pay | Admitting: *Deleted

## 2016-04-24 DIAGNOSIS — R918 Other nonspecific abnormal finding of lung field: Secondary | ICD-10-CM | POA: Diagnosis not present

## 2016-04-24 DIAGNOSIS — E1142 Type 2 diabetes mellitus with diabetic polyneuropathy: Secondary | ICD-10-CM

## 2016-04-24 DIAGNOSIS — C3491 Malignant neoplasm of unspecified part of right bronchus or lung: Secondary | ICD-10-CM | POA: Insufficient documentation

## 2016-04-24 DIAGNOSIS — C3411 Malignant neoplasm of upper lobe, right bronchus or lung: Secondary | ICD-10-CM | POA: Diagnosis not present

## 2016-04-24 DIAGNOSIS — E1140 Type 2 diabetes mellitus with diabetic neuropathy, unspecified: Secondary | ICD-10-CM

## 2016-04-24 DIAGNOSIS — Z5111 Encounter for antineoplastic chemotherapy: Secondary | ICD-10-CM

## 2016-04-24 HISTORY — DX: Type 2 diabetes mellitus with diabetic neuropathy, unspecified: E11.40

## 2016-04-24 LAB — CBC WITH DIFFERENTIAL/PLATELET
BASO%: 0.4 % (ref 0.0–2.0)
Basophils Absolute: 0 10*3/uL (ref 0.0–0.1)
EOS%: 0.9 % (ref 0.0–7.0)
Eosinophils Absolute: 0.1 10*3/uL (ref 0.0–0.5)
HEMATOCRIT: 43.9 % (ref 38.4–49.9)
HEMOGLOBIN: 14.9 g/dL (ref 13.0–17.1)
LYMPH#: 1.5 10*3/uL (ref 0.9–3.3)
LYMPH%: 21.2 % (ref 14.0–49.0)
MCH: 32.3 pg (ref 27.2–33.4)
MCHC: 33.9 g/dL (ref 32.0–36.0)
MCV: 95 fL (ref 79.3–98.0)
MONO#: 0.6 10*3/uL (ref 0.1–0.9)
MONO%: 8 % (ref 0.0–14.0)
NEUT#: 4.8 10*3/uL (ref 1.5–6.5)
NEUT%: 69.5 % (ref 39.0–75.0)
PLATELETS: 155 10*3/uL (ref 140–400)
RBC: 4.62 10*6/uL (ref 4.20–5.82)
RDW: 13.8 % (ref 11.0–14.6)
WBC: 6.8 10*3/uL (ref 4.0–10.3)

## 2016-04-24 LAB — COMPREHENSIVE METABOLIC PANEL
ALBUMIN: 3.5 g/dL (ref 3.5–5.0)
ALK PHOS: 62 U/L (ref 40–150)
ALT: 29 U/L (ref 0–55)
AST: 23 U/L (ref 5–34)
Anion Gap: 11 mEq/L (ref 3–11)
BILIRUBIN TOTAL: 0.77 mg/dL (ref 0.20–1.20)
BUN: 12.2 mg/dL (ref 7.0–26.0)
CALCIUM: 8.9 mg/dL (ref 8.4–10.4)
CO2: 23 mEq/L (ref 22–29)
CREATININE: 1 mg/dL (ref 0.7–1.3)
Chloride: 104 mEq/L (ref 98–109)
EGFR: 78 mL/min/{1.73_m2} — ABNORMAL LOW (ref >90)
Glucose: 144 mg/dl — ABNORMAL HIGH (ref 70–140)
Potassium: 4.2 mEq/L (ref 3.5–5.1)
Sodium: 138 mEq/L (ref 136–145)
TOTAL PROTEIN: 7.1 g/dL (ref 6.4–8.3)

## 2016-04-24 MED ORDER — LIDOCAINE-PRILOCAINE 2.5-2.5 % EX CREA: 1.0000 "application " | TOPICAL_CREAM | CUTANEOUS | 0 refills | Status: DC | PRN

## 2016-04-24 MED ORDER — PROCHLORPERAZINE MALEATE 10 MG PO TABS: 10.0000 mg | ORAL_TABLET | Freq: Four times a day (QID) | ORAL | 0 refills | Status: DC | PRN

## 2016-04-24 NOTE — Progress Notes (Signed)
START ON PATHWAY REGIMEN - Non-Small Cell Lung  [id number]: Nab-Paclitaxel (Abraxane) 100 mg/m2 D1, 8, 15 + Carboplatin AUC=6 D1 q21 Days x 4 Cycles   A cycle is every 21 days:     Nab-Paclitaxel (protein bound) (Abraxane(R)) 100 mg/m2 in NS to a concentration of 5 mg/mL given IV over 30 mins days 1, 8, and 15. Administer prior to carboplatin on day 1 Dose Mod: None     Carboplatin (Paraplatin(R)) AUC = 6 in 250 mL NS IV over 1 hour on day 1 Dose Mod: None Additional Orders: * All AUC calculations intended to be used in [employer] formula  **Always confirm dose/schedule in your pharmacy ordering system**    Patient Characteristics: Stage IV Metastatic, Squamous, PS = 0, 1, First Line, PD-L1 Expression Positive 1-49% (TPS) / Negative / Not Tested / Not a Candidate for Immunotherapy AJCC M Stage: 1 AJCC N Stage: 3 AJCC T Stage: 2b Current Disease Status: Distant Metastases AJCC Stage Grouping: IV Histology: Squamous Cell Line of therapy: First Line PD-L1 Expression Status: Quantity Not Sufficient Performance Status: PS = 0, 1 Would you be surprised if this patient died  in the next year? I would NOT be surprised if this patient died in the next year  Intent of Therapy: Non-Curative / Palliative Intent, Discussed with Patient

## 2016-04-24 NOTE — Progress Notes (Signed)
lmtcb

## 2016-04-24 NOTE — Telephone Encounter (Signed)
Gave patient avs report and appointments for November thru January. Central radiology will call re scan.  Patient also given appointment with Dr. Tammi Klippel for 11/27 @ 9:30 am to arrive 9:15 am - spoke with [facility].  Patient given port placement appointment for 11/20 @ 1:30 pm to arrive at 11:30 am - spoke with Tiffany.

## 2016-04-24 NOTE — Progress Notes (Signed)
Spoke with pt and notified of results per Dr. Wert. Pt verbalized understanding and denied any questions. 

## 2016-04-24 NOTE — Progress Notes (Signed)
[place] [phone]   [phone]  CONSULT NOTE  REFERRING PHYSICIAN: Dr. Christinia Gully  REASON FOR CONSULTATION:  75 years old white male recently diagnosed with lung cancer.  HPI Tim Turner is a 75 y.o. male was past medical history significant for hypertension, diabetes mellitus, systolic heart murmur, obstructive sleep apnea, pneumonia, dyslipidemia, history of prostate cancer 10 years ago status post surgical resection. The patient also has a history of heavy smoking but quit 30 years ago. He as been complaining of chronic cough and shortness breath for several months. He was also treated for pneumonia several weeks ago. Because of the persistent shortness breath and had chest x-ray performed on 03/13/2016 and it showed new right upper lobe lung opacity. This was followed by CT scan of the chest on 03/27/2016 and it showed right upper lobe collapse secondary to suprahilar mass measuring 4.9 x 4.1 cm. There was also 0.7 cm right middle lobe nodule as well as numerous left lower lobe pulmonary nodules including 1.1 x 1.3 cm, 1.2 x 1.4 cm and 1.2 x 1.5 cm nodules. There are also multiple enlarged mediastinal and hilar lymph nodes bilaterally including 3.4 x 1.9 cm right paratracheal node and subcarinal node measuring 3.4 x 2.4 cm. The patient was seen by Dr. Melvyn Novas and on 04/11/2016 he underwent the do flexible fiberoptic bronchoscopy with bronchial washing of the right upper lobe as well as endobronchial biopsies. The final pathology [id number]) of the endobronchial biopsy of the right upper lobe orifice showed squamous cell carcinoma. The tissue block was sent for PDL 1 testing but the results are still pending. A PET scan performed on 04/23/2016 showed 6.0 cm hypermetabolic central right upper lobe mass obstructing the right upper lobe bronchus with postobstructive collapse. This is consistent with primary bronchogenic carcinoma. There was hypermetabolic  bilateral mediastinal and bilateral hilar lymphadenopathy consistent with metastatic disease. There was also increased number and size of several hypermetabolic pulmonary nodules in the left lower lobe and right upper lobe highly suspicious for pulmonary metastasis. There was no evidence of extrathoracic metastatic disease. Dr. Melvyn Novas kindly referred the patient to me today for evaluation and recommendation regarding treatment of his condition. When seen today the patient continues to have cough as well as shortness breath with exertion. He denied having any significant chest pain or hemoptysis. He has no significant weight loss or night sweats. He has no nausea, vomiting, diarrhea or constipation. He has occasional headache but he has it for several years. Family history significant for mother with heart attack and father with a stroke. The patient is married and has 4 children. He was accompanied today by his wife became. He used to work as a Administrator. He has a history of smoking up to 4 pack per day for 20 years but quit 30 years ago. He has no history of alcohol or drug abuse.  HPI  Past Medical History:  Diagnosis Date  . Arthritis   . Diabetes mellitus without complication ([place])   . History of prostate cancer   . Hypercholesteremia   . Hypertension   . Sleep apnea     Past Surgical History:  Procedure Laterality Date  . BACK SURGERY  1985   nerve decompression  . HERNIA REPAIR  2007   2 surgeries  . PROSTATE SURGERY  2008  . VIDEO BRONCHOSCOPY Bilateral 04/11/2016   Procedure: VIDEO BRONCHOSCOPY WITHOUT FLUORO;  Surgeon: Tanda Rockers, MD;  Location: [facility];  Service:  Cardiopulmonary;  Laterality: Bilateral;    Family History  Problem Relation Age of Onset  . Heart disease Mother   . Breast cancer Mother   . Heart disease Father     Social History Social History  Substance Use Topics  . Smoking status: Former Smoker    Packs/day: 4.00    Years: 35.00    Types:  Cigarettes    Quit date: 05/02/1985  . Smokeless tobacco: Never Used  . Alcohol use No    Allergies  Allergen Reactions  . Morphine And Related Itching    Current Outpatient Prescriptions  Medication Sig Dispense Refill  . amLODipine (NORVASC) 10 MG tablet Take 10 mg by mouth daily.     . citalopram (CELEXA) 20 MG tablet Take 20 mg by mouth daily.     . famotidine (PEPCID) 20 MG tablet TAKE 1 TABLET BY MOUTH AT BEDTIME 90 tablet 1  . glipiZIDE (GLUCOTROL XL) 10 MG 24 hr tablet Take 10 mg by mouth daily with breakfast.    . ipratropium-albuterol (DUONEB) 0.5-2.5 (3) MG/3ML SOLN Take 3 mLs by nebulization every 6 (six) hours as needed.    Marland Kitchen losartan (COZAAR) 50 MG tablet Take 50 mg by mouth daily.     . meperidine (DEMEROL) 50 MG tablet Take 1 tablet (50 mg total) by mouth every 4 (four) hours as needed for severe pain. 40 tablet 0  . metFORMIN (GLUCOPHAGE-XR) 500 MG 24 hr tablet Take 1,000 mg by mouth daily with breakfast.     . methylphenidate (RITALIN) 10 MG tablet 1 twice daily as needed 30 tablet 0  . metoprolol tartrate (LOPRESSOR) 25 MG tablet Take 25 mg by mouth 2 (two) times daily.    . pantoprazole (PROTONIX) 40 MG tablet Take 1 tablet (40 mg total) by mouth 2 (two) times daily before a meal. 60 tablet 11  . pravastatin (PRAVACHOL) 40 MG tablet      No current facility-administered medications for this visit.     Review of Systems  Constitutional: positive for fatigue Eyes: negative Ears, nose, mouth, throat, and face: negative Respiratory: positive for cough and dyspnea on exertion Cardiovascular: negative Gastrointestinal: negative Genitourinary:negative Integument/breast: negative Hematologic/lymphatic: negative Musculoskeletal:negative Neurological: negative Behavioral/Psych: negative Endocrine: negative Allergic/Immunologic: negative  Physical Exam  ZOX:WRUEA, healthy, no distress, well nourished, well developed and anxious SKIN: skin color, texture,  turgor are normal, no rashes or significant lesions HEAD: Normocephalic, No masses, lesions, tenderness or abnormalities EYES: normal, PERRLA, Conjunctiva are pink and non-injected EARS: External ears normal, Canals clear OROPHARYNX:no exudate, no erythema and lips, buccal mucosa, and tongue normal  NECK: supple, no adenopathy, no JVD LYMPH:  no palpable lymphadenopathy, no hepatosplenomegaly LUNGS: prolonged expiratory phase, expiratory wheezes bilaterally HEART: regular rate & rhythm, no murmurs and no gallops ABDOMEN:abdomen soft, non-tender, obese, normal bowel sounds and no masses or organomegaly BACK: Back symmetric, no curvature., No CVA tenderness EXTREMITIES:no joint deformities, effusion, or inflammation, no edema, no skin discoloration  NEURO: alert & oriented x 3 with fluent speech, no focal motor/sensory deficits  PERFORMANCE STATUS: ECOG 1  LABORATORY DATA: Lab Results  Component Value Date   WBC 6.8 04/24/2016   HGB 14.9 04/24/2016   HCT 43.9 04/24/2016   MCV 95.0 04/24/2016   PLT 155 04/24/2016      Chemistry      Component Value Date/Time   NA 138 04/24/2016 1334   K 4.2 04/24/2016 1334   CL 103 03/22/2016 1504   CO2 23 04/24/2016 1334  BUN 12.2 04/24/2016 1334   CREATININE 1.0 04/24/2016 1334      Component Value Date/Time   CALCIUM 8.9 04/24/2016 1334   ALKPHOS 62 04/24/2016 1334   AST 23 04/24/2016 1334   ALT 29 04/24/2016 1334   BILITOT 0.77 04/24/2016 1334       RADIOGRAPHIC STUDIES: Ct Chest W Contrast  Result Date: 03/27/2016 CLINICAL DATA:  Chronic cough and sinus drainage. Right upper lobe collapse on recent radiographs. History of diabetes and prostate cancer. EXAM: CT CHEST WITH CONTRAST TECHNIQUE: Multidetector CT imaging of the chest was performed during intravenous contrast administration. CONTRAST:  52m ISOVUE-300 IOPAMIDOL (ISOVUE-300) INJECTION 61% COMPARISON:  Radiographs 03/13/2016.  CT 04/19/2015 and 08/09/2010. FINDINGS:  Cardiovascular: There is mild atherosclerosis of the aorta and coronary arteries. No acute vascular findings are demonstrated. No evidence of pulmonary embolism. The heart size is normal. There is no pericardial effusion. Mediastinum/Nodes: There are multiple enlarged mediastinal and hilar lymph nodes bilaterally. Representative nodes include a 3.4 x 1.9 cm right paratracheal node on image 44 and a subcarinal node measuring 3.4 x 2.4 cm on image 82. The thyroid gland, trachea and esophagus demonstrate no significant findings. Lungs/Pleura: Again demonstrated is extensive, predominantly non calcified pleural plaque formation bilaterally. There is no significant pleural effusion. There is complete right upper lobe collapse with an abrupt cut off of the right upper lobe bronchus. There is a probable central right suprahilar mass, partly obscured by the adjacent lobar collapse. This is estimated to measure approximately 4.9 x 4.1 cm on image 60. There is a 7 mm right middle lobe nodule on image 58. There are numerous left lower lobe pulmonary nodules, including an 11 x 13 mm nodule on image 95, a 12 x 14 mm nodule on image 110 and a 12 x 15 mm nodule on image 118. No focal nodules are seen in the right lower or left upper lobes. There is patchy right basilar ground-glass opacity. Upper abdomen: No acute findings are seen within the visualized upper abdomen. There is mild contour irregularity of the liver. There is a cyst in the upper pole of the left kidney. No evidence of adrenal mass. Small lymph nodes in the porta hepatis are stable. Musculoskeletal/Chest wall: There is no chest wall mass or suspicious osseous finding. Mild thoracic spondylosis. IMPRESSION: 1. Right upper lobe collapse, likely secondary to a suprahilar mass with endobronchial extension/cut off. There are additional pulmonary nodules within the right middle and left lower lobes, as well as mediastinal and hilar lymphadenopathy. Findings are highly  suspicious for metastatic bronchogenic carcinoma. Tissue sampling via bronchoscopy recommended. PET-CT may be helpful for further staging. 2. 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 No typical findings of mesothelioma. 3. Mild atherosclerosis as described. Aortic Atherosclerosis (ICD10-170.0) 4. These results will be called to the ordering clinician or representative by the Radiologist Assistant, and communication documented in the PACS or zVision Dashboard. Electronically Signed   By: WRichardean SaleM.D.   On: 03/27/2016 11:07   Dg Op Swallowing Func-medicare/speech Path  Result Date: 03/27/2016 Objective Swallowing Evaluation: Type of Study: MBS-Modified Barium Swallow Study Patient Details Name: Tim ESHBACHMRN: [id number] of Birth: 8October 10, 1942Today's Date: 03/27/2016 Time: SLP Start Time (ACUTE ONLY): 1000-SLP Stop Time (ACUTE ONLY): 1020 SLP Time Calculation (min) (ACUTE ONLY): 20 min Past Medical History: Past Medical History: Diagnosis Date . Arthritis  . [place]  . History of prostate cancer  . Hypercholesteremia  . Hypertension  .  Sleep apnea  Past Surgical History: Past Surgical History: Procedure Laterality Date . BACK SURGERY  1985  nerve decompression . HERNIA REPAIR  2007  2 surgeries . PROSTATE SURGERY  2008 HPI: 75 y.o. male with new RUL opacity extending to right apex; concern for aspiration given hx of choking with food. He states he is constantly clearing his throat.  Pt with chronic cough, OS. He is compliant with his Protonix, Claritin and his Duonebs. He is also compliant with his CPAP. His wife states he is not following the GERD diet.  Frequency of "strangling" with POs is 1x/week per pt's report.  Subjective: pleasant; wife present Assessment / Plan / Recommendation CHL IP CLINICAL IMPRESSIONS 03/27/2016 Therapy Diagnosis WFL Clinical Impression Pt presents with normal oropharyngeal swallow function marked by  swallow onset at level of valleculae (normal in elderly); no pharyngeal residue post-swallow; adequate and consistent airway closure, even when taxed with large consecutive thin liquid boluses.  13 mm barium pill swallowed without difficulty.  Brief screen of esophagus revealed no backflow.   Doubt oropharyngeal dysphagia as contributor to respiratory issues.  No further SLP recommendations - continue diet; we discussed general GERD precautions, including not eating within two hours of bedtime; pt states he will begin practicing this rec.   Impact on safety and function No limitations   CHL IP TREATMENT RECOMMENDATION 03/27/2016 Treatment Recommendations No treatment recommended at this time   No flowsheet data found. CHL IP DIET RECOMMENDATION 03/27/2016 SLP Diet Recommendations Regular solids;Thin liquid Liquid Administration via Cup;Straw Medication Administration Whole meds with liquid Compensations -- Postural Changes --   CHL IP OTHER RECOMMENDATIONS 03/27/2016 Recommended Consults -- Oral Care Recommendations Oral care BID Other Recommendations --   CHL IP FOLLOW UP RECOMMENDATIONS 03/27/2016 Follow up Recommendations None   No flowsheet data found.     CHL IP ORAL PHASE 03/27/2016 Oral Phase WFL Oral - Pudding Teaspoon -- Oral - Pudding Cup -- Oral - Honey Teaspoon -- Oral - Honey Cup -- Oral - Nectar Teaspoon -- Oral - Nectar Cup -- Oral - Nectar Straw -- Oral - Thin Teaspoon -- Oral - Thin Cup -- Oral - Thin Straw -- Oral - Puree -- Oral - Mech Soft -- Oral - Regular -- Oral - Multi-Consistency -- Oral - Pill -- Oral Phase - Comment --  CHL IP PHARYNGEAL PHASE 03/27/2016 Pharyngeal Phase WFL Pharyngeal- Pudding Teaspoon -- Pharyngeal -- Pharyngeal- Pudding Cup -- Pharyngeal -- Pharyngeal- Honey Teaspoon -- Pharyngeal -- Pharyngeal- Honey Cup -- Pharyngeal -- Pharyngeal- Nectar Teaspoon -- Pharyngeal -- Pharyngeal- Nectar Cup -- Pharyngeal -- Pharyngeal- Nectar Straw -- Pharyngeal -- Pharyngeal- Thin  Teaspoon -- Pharyngeal -- Pharyngeal- Thin Cup -- Pharyngeal -- Pharyngeal- Thin Straw -- Pharyngeal -- Pharyngeal- Puree -- Pharyngeal -- Pharyngeal- Mechanical Soft -- Pharyngeal -- Pharyngeal- Regular -- Pharyngeal -- Pharyngeal- Multi-consistency -- Pharyngeal -- Pharyngeal- Pill -- Pharyngeal -- Pharyngeal Comment --  CHL IP CERVICAL ESOPHAGEAL PHASE 03/27/2016 Cervical Esophageal Phase WFL Pudding Teaspoon -- Pudding Cup -- Honey Teaspoon -- Honey Cup -- Nectar Teaspoon -- Nectar Cup -- Nectar Straw -- Thin Teaspoon -- Thin Cup -- Thin Straw -- Puree -- Mechanical Soft -- Regular -- Multi-consistency -- Pill -- Cervical Esophageal Comment -- CHL IP GO 03/27/2016 Functional Assessment Tool Used clinical judgment Functional Limitations Swallowing Swallow Current Status (I7124) [place] Swallow Goal Status (P8099) [facility] Swallow Discharge Status (I3382) [place] Motor Speech Current Status (N0539) (None) Motor Speech Goal Status (J6734) (None) Motor Speech Goal Status (L9379) (None)  Spoken Language Comprehension Current Status [phone]) (None) Spoken Language Comprehension Goal Status [phone]) (None) Spoken Language Comprehension Discharge Status [phone]) (None) Spoken Language Expression Current Status [phone]) (None) Spoken Language Expression Goal Status [phone]) (None) Spoken Language Expression Discharge Status [phone]) (None) Attention Current Status (N2778) (None) Attention Goal Status (E4235) (None) Attention Discharge Status [phone]) (None) Memory Current Status (R1540) (None) Memory Goal Status (G8676) (None) Memory Discharge Status (P9509) (None) Voice Current Status (T2671) (None) Voice Goal Status (I4580) (None) Voice Discharge Status [phone]) (None) Other Speech-Language Pathology Functional Limitation [phone]) (None) Other Speech-Language Pathology Functional Limitation Goal Status (L9767) (None) Other Speech-Language Pathology Functional Limitation Discharge Status [phone]) (None) Juan Quam Laurice 03/27/2016, 11:06  AM            CLINICAL DATA:  Recurrent pneumonia. Evaluate for possible underlying aspiration. EXAM: MODIFIED BARIUM SWALLOW TECHNIQUE: Different consistencies of barium were administered orally to the patient by the Speech Pathologist. Imaging of the pharynx was performed in the lateral projection. FLUOROSCOPY TIME:  Fluoroscopy Time:  41 seconds of pulsed fluoro. Radiation Exposure Index (if provided by the fluoroscopic device): Not available Number of Acquired Spot Images: 0 COMPARISON:  Chest CT 03/27/2016 and 04/19/2015. Chest radiographs 03/13/2016. FINDINGS: Thin liquid- within normal limits Nectar thick liquid- not administered. Honey- not administered. Pure- within normal limits Cracker-not administered. Pure with cracker- not administered. Barium tablet - passed without delay into the thoracic esophagus and through the gastroesophageal junction. IMPRESSION: Normal modified speech swallow. No evidence of aspiration or penetration. Please refer to the Speech Pathologists report for complete details and recommendations. Electronically Signed   By: Richardean Sale M.D.   On: 03/27/2016 10:38   Nm Pet Image Initial (pi) Skull Base To Thigh  Result Date: 04/23/2016 CLINICAL DATA:  Initial treatment strategy for right upper lobe mass with peripheral collapse. EXAM: NUCLEAR MEDICINE PET SKULL BASE TO THIGH TECHNIQUE: 12.2 mCi F-18 FDG was injected intravenously. Full-ring PET imaging was performed from the skull base to thigh after the radiotracer. CT data was obtained and used for attenuation correction and anatomic localization. FASTING BLOOD GLUCOSE:  Value: 135 mg/dl COMPARISON:  Chest CT on 03/27/2016 FINDINGS: NECK No hypermetabolic lymph nodes in the neck. CHEST A central right upper lobe mass is seen which obstructs the central right upper lobe bronchus and is contiguous with the right hilum. This measures 6.1 x 5.7 cm on image 75/4, and has SUV max of 13.4. This mass causes postobstructive right  upper lobe collapse. Hypermetabolic mediastinal lymphadenopathy is seen in the lateral aortic, right paratracheal, and subcarinal regions, with index lymph node in the right paratracheal region measuring 2.1 cm short axis on image 69/4, with SUV max of 6.2. Hypermetabolic bilateral hilar lymphadenopathy is also demonstrated with index lymph node in the left hilum measuring 2.3 cm on image 89/4, with SUV max of 5.8. Several hypermetabolic pulmonary nodules are seen in the left lower lobe, some of which are new or mildly increased in size. New nodule in the anterior left lower lobe measures 1.6 cm on image 98/4 and is hypermetabolic, although this cannot be accurately measured due to proximity to hyper metabolic left hilar lymphadenopathy. Another new 17 mm pulmonary nodule in posterior left lower lobe on image 108/4 has SUV max of 4.4. A 9 mm pulmonary nodule is seen in the right middle lobe on image 79/4 which is mildly increased in size from 7 mm on previous study. This shows low-grade metabolic activity with SUV max of 1.9. No evidence of pleural  effusion. Bilateral pleural plaque is seen with only minimal calcification of some plaque in the anterior left hemithorax. This shows absence of metabolic activity. ABDOMEN/PELVIS No abnormal hypermetabolic activity within the liver, pancreas, adrenal glands, or spleen. No hypermetabolic lymph nodes in the abdomen or pelvis. Left upper pole renal cyst shows absence of hypermetabolic activity. Aortic atherosclerosis. Colonic diverticulosis noted. Surgical mesh noted in suprapubic anterior abdominal wall. Penile prosthesis noted with reservoir in the left pelvis. SKELETON No focal hypermetabolic activity to suggest skeletal metastasis. IMPRESSION: 6 cm hypermetabolic central right upper lobe mass obstructing the right upper lobe bronchus, with postobstructive collapse. This is consistent with primary bronchogenic carcinoma. Hypermetabolic bilateral mediastinal and bilateral  hilar lymphadenopathy, consistent with metastatic disease. Increased number and size of several hypermetabolic pulmonary nodules in left lower lobe and right upper lobe, highly suspicious for pulmonary metastases. No evidence of extra thoracic metastatic disease. Electronically Signed   By: Earle Gell M.D.   On: 04/23/2016 09:42   Ct Maxillofacial Limited Wo Contrast  Result Date: 03/27/2016 CLINICAL DATA:  Chronic cough, sinus drainage EXAM: CT PARANASAL SINUS LIMITED WITHOUT CONTRAST TECHNIQUE: Non-contiguous multidetector CT images of the paranasal sinuses were obtained in a single plane without contrast. COMPARISON:  None. FINDINGS: The paranasal sinuses including the sphenoid, ethmoid, frontal and maxillary sinuses are identified without evidence of disease. No air-fluid levels are identified. The nasal septum is midline. The ostiomeatal complexes are patent bilaterally. The frontoethmoidal recesses are normal bilaterally. No bony abnormalities are identified. The visualized portions of the brain and orbits are unremarkable. IMPRESSION: No evidence of sinusitis. Electronically Signed   By: Kathreen Devoid   On: 03/27/2016 12:26    ASSESSMENT: This is a very pleasant 75 years old white male recently diagnosed with a stage IV (T2b, N3, M1 A) non-small cell lung cancer, squamous cell carcinoma presented with right upper lobe central obstructing mass in addition to bilateral mediastinal and hilar lymphadenopathy as well as bilateral pulmonary nodules diagnosed in November 2017   PLAN: I had a lengthy discussion with the patient and his wife today about his current disease stage, prognosis and treatment options. I explained to the patient and his wife that he has incurable condition and all the treatment will be off palliative nature. I recommended for the patient to complete the staging workup by ordering a MRI of the brain to rule out brain metastasis. I discussed with the patient has treatment options  including palliative care and hospice referral versus consideration of systemic chemotherapy with carboplatin and paclitaxel/Abraxane. The patient is interested in proceeding with systemic chemotherapy. He will be treated with carboplatin for AUC of 5 on day 1 and Abraxane 100 MG/M2 on days 1 and 8 every 3 weeks. Approximate was chosen because of the low risk for hypersensitivity reaction as well as decreased incidence of peripheral neuropathy compared to paclitaxel in a patient with history of diabetic neuropathy and also because of the better response rate in patient with squamous cell carcinoma.  I discussed with the patient adverse effect of this treatment including but not limited to alopecia, myelosuppression, nausea and vomiting, peripheral neuropathy, liver or renal dysfunction. I will also refer the patient to radiation oncology for consideration of palliative radiotherapy to the Center lobe obstructing the right upper lobe lung mass. I will refer the patient to interventional radiology for consideration of Port-A-Cath placement. I will arrange for the patient to have a chemotherapy education class before starting the first dose of his chemotherapy next week. I  will also call his pharmacy with prescription for Compazine 10 mg by mouth every 6 hours as needed for nausea in addition to Emla Cream to be applied to the Port-A-Cath site before his chemotherapy. The patient would come back for follow-up visit in 2 weeks for evaluation and management of any adverse effect of his chemotherapy. He was advised to call immediately if he has any concerning symptoms in the interval. The patient voices understanding of current disease status and treatment options and is in agreement with the current care plan.  All questions were answered. The patient knows to call the clinic with any problems, questions or concerns. We can certainly see the patient much sooner if necessary.  Thank you so much for allowing  me to participate in the care of Tim Turner. I will continue to follow up the patient with you and assist in his care.  I spent 55 minutes counseling the patient face to face. The total time spent in the appointment was 80 minutes.  Disclaimer: This note was dictated with voice recognition software. Similar sounding words can inadvertently be transcribed and may not be corrected upon review.   Tamla Winkels K. April 24, 2016, 3:00 PM

## 2016-04-25 ENCOUNTER — Telehealth: Payer: Self-pay | Admitting: Internal Medicine

## 2016-04-25 ENCOUNTER — Other Ambulatory Visit: Payer: Self-pay | Admitting: Internal Medicine

## 2016-04-25 ENCOUNTER — Other Ambulatory Visit: Payer: Self-pay | Admitting: Radiology

## 2016-04-25 ENCOUNTER — Ambulatory Visit (HOSPITAL_COMMUNITY): Payer: Medicare Other

## 2016-04-25 ENCOUNTER — Encounter: Payer: Self-pay | Admitting: *Deleted

## 2016-04-25 NOTE — Telephone Encounter (Signed)
Faxed records to [facility]

## 2016-04-25 NOTE — Telephone Encounter (Signed)
Wife called to have all appointments canceled except 11/20 appointment to have port-a-cath placed. Per the wife, the patient will be  going to a different facility closer to home.

## 2016-04-26 DIAGNOSIS — C3411 Malignant neoplasm of upper lobe, right bronchus or lung: Secondary | ICD-10-CM | POA: Diagnosis not present

## 2016-04-29 ENCOUNTER — Encounter (HOSPITAL_COMMUNITY): Payer: Self-pay

## 2016-04-29 ENCOUNTER — Encounter: Payer: Self-pay | Admitting: *Deleted

## 2016-04-29 ENCOUNTER — Ambulatory Visit (HOSPITAL_COMMUNITY)
Admission: RE | Admit: 2016-04-29 | Discharge: 2016-04-29 | Disposition: A | Discharge status: Home / Self Care | Payer: Medicare Other | Source: Ambulatory Visit | Attending: Interventional Radiology | Admitting: Interventional Radiology

## 2016-04-29 ENCOUNTER — Other Ambulatory Visit: Payer: Self-pay | Admitting: Internal Medicine

## 2016-04-29 ENCOUNTER — Ambulatory Visit (HOSPITAL_COMMUNITY)
Admission: RE | Admit: 2016-04-29 | Discharge: 2016-04-29 | Disposition: A | Discharge status: Home / Self Care | Payer: Medicare Other | Source: Ambulatory Visit | Attending: Internal Medicine | Admitting: Internal Medicine

## 2016-04-29 DIAGNOSIS — C3491 Malignant neoplasm of unspecified part of right bronchus or lung: Secondary | ICD-10-CM

## 2016-04-29 DIAGNOSIS — Z79899 Other long term (current) drug therapy: Secondary | ICD-10-CM | POA: Insufficient documentation

## 2016-04-29 DIAGNOSIS — C3411 Malignant neoplasm of upper lobe, right bronchus or lung: Secondary | ICD-10-CM | POA: Diagnosis not present

## 2016-04-29 DIAGNOSIS — E7800 Pure hypercholesterolemia, unspecified: Secondary | ICD-10-CM | POA: Insufficient documentation

## 2016-04-29 DIAGNOSIS — G4730 Sleep apnea, unspecified: Secondary | ICD-10-CM | POA: Diagnosis not present

## 2016-04-29 DIAGNOSIS — Z7984 Long term (current) use of oral hypoglycemic drugs: Secondary | ICD-10-CM | POA: Diagnosis not present

## 2016-04-29 DIAGNOSIS — E1140 Type 2 diabetes mellitus with diabetic neuropathy, unspecified: Secondary | ICD-10-CM | POA: Diagnosis not present

## 2016-04-29 DIAGNOSIS — Z8546 Personal history of malignant neoplasm of prostate: Secondary | ICD-10-CM | POA: Insufficient documentation

## 2016-04-29 DIAGNOSIS — M1990 Unspecified osteoarthritis, unspecified site: Secondary | ICD-10-CM | POA: Insufficient documentation

## 2016-04-29 DIAGNOSIS — I10 Essential (primary) hypertension: Secondary | ICD-10-CM | POA: Insufficient documentation

## 2016-04-29 DIAGNOSIS — Z803 Family history of malignant neoplasm of breast: Secondary | ICD-10-CM | POA: Insufficient documentation

## 2016-04-29 DIAGNOSIS — Z87891 Personal history of nicotine dependence: Secondary | ICD-10-CM | POA: Insufficient documentation

## 2016-04-29 HISTORY — PX: IR GENERIC HISTORICAL: IMG1180011

## 2016-04-29 LAB — CBC WITH DIFFERENTIAL/PLATELET
BASOS ABS: 0 10*3/uL (ref 0.0–0.1)
BASOS PCT: 0 %
EOS PCT: 1 %
Eosinophils Absolute: 0.1 10*3/uL (ref 0.0–0.7)
HCT: 46.4 % (ref 39.0–52.0)
Hemoglobin: 15.6 g/dL (ref 13.0–17.0)
Lymphocytes Relative: 21 %
Lymphs Abs: 1.6 10*3/uL (ref 0.7–4.0)
MCH: 32 pg (ref 26.0–34.0)
MCHC: 33.6 g/dL (ref 30.0–36.0)
MCV: 95.1 fL (ref 78.0–100.0)
MONO ABS: 0.8 10*3/uL (ref 0.1–1.0)
MONOS PCT: 11 %
Neutro Abs: 4.9 10*3/uL (ref 1.7–7.7)
Neutrophils Relative %: 67 %
PLATELETS: 172 10*3/uL (ref 150–400)
RBC: 4.88 MIL/uL (ref 4.22–5.81)
RDW: 13.7 % (ref 11.5–15.5)
WBC: 7.3 10*3/uL (ref 4.0–10.5)

## 2016-04-29 LAB — GLUCOSE, CAPILLARY: GLUCOSE-CAPILLARY: 120 mg/dL — AB (ref 65–99)

## 2016-04-29 LAB — PROTIME-INR
INR: 1.02
Prothrombin Time: 13.4 seconds (ref 11.4–15.2)

## 2016-04-29 MED ORDER — CEFAZOLIN SODIUM-DEXTROSE 2-4 GM/100ML-% IV SOLN
2.0000 g | INTRAVENOUS | Status: AC
  Administered 2016-04-29: 2 g via INTRAVENOUS
  Filled 2016-04-29: qty 100

## 2016-04-29 MED ORDER — SODIUM CHLORIDE 0.9 % IV SOLN
INTRAVENOUS | Status: DC
  Administered 2016-04-29: 12:00:00 via INTRAVENOUS

## 2016-04-29 MED ORDER — HEPARIN SOD (PORK) LOCK FLUSH 100 UNIT/ML IV SOLN
INTRAVENOUS | Status: AC
  Filled 2016-04-29: qty 5

## 2016-04-29 MED ORDER — LIDOCAINE HCL 1 % IJ SOLN
INTRAMUSCULAR | Status: AC
  Filled 2016-04-29: qty 20

## 2016-04-29 MED ORDER — HEPARIN SOD (PORK) LOCK FLUSH 100 UNIT/ML IV SOLN
INTRAVENOUS | Status: DC | PRN
  Administered 2016-04-29: 500 [IU]

## 2016-04-29 MED ORDER — FENTANYL CITRATE (PF) 100 MCG/2ML IJ SOLN
INTRAMUSCULAR | Status: AC | PRN
  Administered 2016-04-29: 25 ug via INTRAVENOUS
  Administered 2016-04-29: 50 ug via INTRAVENOUS

## 2016-04-29 MED ORDER — FENTANYL CITRATE (PF) 100 MCG/2ML IJ SOLN
INTRAMUSCULAR | Status: AC
  Filled 2016-04-29: qty 6

## 2016-04-29 MED ORDER — MIDAZOLAM HCL 2 MG/2ML IJ SOLN
INTRAMUSCULAR | Status: AC | PRN
  Administered 2016-04-29 (×4): 1 mg via INTRAVENOUS

## 2016-04-29 MED ORDER — MIDAZOLAM HCL 2 MG/2ML IJ SOLN
INTRAMUSCULAR | Status: AC
  Filled 2016-04-29: qty 6

## 2016-04-29 MED ORDER — LIDOCAINE-EPINEPHRINE (PF) 2 %-1:200000 IJ SOLN
INTRAMUSCULAR | Status: DC | PRN
  Administered 2016-04-29: 10 mL via INTRADERMAL

## 2016-04-29 MED ORDER — LIDOCAINE-EPINEPHRINE (PF) 2 %-1:200000 IJ SOLN
INTRAMUSCULAR | Status: AC
  Filled 2016-04-29: qty 20

## 2016-04-29 NOTE — Discharge Instructions (Signed)
Implanted Port Home Guide °An implanted port is a type of central line that is placed under the skin. Central lines are used to provide IV access when treatment or nutrition needs to be given through a person's veins. Implanted ports are used for long-term IV access. An implanted port may be placed because:  °· You need IV medicine that would be irritating to the small veins in your hands or arms.   °· You need long-term IV medicines, such as antibiotics.   °· You need IV nutrition for a long period.   °· You need frequent blood draws for lab tests.   °· You need dialysis.   °Implanted ports are usually placed in the chest area, but they can also be placed in the upper arm, the abdomen, or the leg. An implanted port has two main parts:  °· Reservoir. The reservoir is round and will appear as a small, raised area under your skin. The reservoir is the part where a needle is inserted to give medicines or draw blood.   °· Catheter. The catheter is a thin, flexible tube that extends from the reservoir. The catheter is placed into a large vein. Medicine that is inserted into the reservoir goes into the catheter and then into the vein.   °HOW WILL I CARE FOR MY INCISION SITE? °Do not get the incision site wet. Bathe or shower as directed by your health care provider.  °HOW IS MY PORT ACCESSED? °Special steps must be taken to access the port:  °· Before the port is accessed, a numbing cream can be placed on the skin. This helps numb the skin over the port site.   °· Your health care provider uses a sterile technique to access the port. °¨ Your health care provider must put on a mask and sterile gloves. °¨ The skin over your port is cleaned carefully with an antiseptic and allowed to dry. °¨ The port is gently pinched between sterile gloves, and a needle is inserted into the port. °· Only "non-coring" port needles should be used to access the port. Once the port is accessed, a blood return should be checked. This helps  ensure that the port is in the vein and is not clogged.   °· If your port needs to remain accessed for a constant infusion, a clear (transparent) bandage will be placed over the needle site. The bandage and needle will need to be changed every week, or as directed by your health care provider.   °· Keep the bandage covering the needle clean and dry. Do not get it wet. Follow your health care provider's instructions on how to take a shower or bath while the port is accessed.   °· If your port does not need to stay accessed, no bandage is needed over the port.   °WHAT IS FLUSHING? °Flushing helps keep the port from getting clogged. Follow your health care provider's instructions on how and when to flush the port. Ports are usually flushed with saline solution or a medicine called heparin. The need for flushing will depend on how the port is used.  °· If the port is used for intermittent medicines or blood draws, the port will need to be flushed:   °¨ After medicines have been given.   °¨ After blood has been drawn.   °¨ As part of routine maintenance.   °· If a constant infusion is running, the port may not need to be flushed.   °HOW LONG WILL MY PORT STAY IMPLANTED? °The port can stay in for as long as your health care   provider thinks it is needed. When it is time for the port to come out, surgery will be done to remove it. The procedure is similar to the one performed when the port was put in.  WHEN SHOULD I SEEK IMMEDIATE MEDICAL CARE? When you have an implanted port, you should seek immediate medical care if:   You notice a bad smell coming from the incision site.   You have swelling, redness, or drainage at the incision site.   You have more swelling or pain at the port site or the surrounding area.   You have a fever that is not controlled with medicine. This information is not intended to replace advice given to you by your health care provider. Make sure you discuss any questions you have with  your health care provider. Document Released: 05/27/2005 Document Revised: 03/17/2013 Document Reviewed: 02/01/2013 Elsevier Interactive Patient Education  2017 [place] Insertion, Care After Refer to this sheet in the next few weeks. These instructions provide you with information on caring for yourself after your procedure. Your health care provider may also give you more specific instructions. Your treatment has been planned according to current medical practices, but problems sometimes occur. Call your health care provider if you have any problems or questions after your procedure. WHAT TO EXPECT AFTER THE PROCEDURE After your procedure, it is typical to have the following:   Discomfort at the port insertion site. Ice packs to the area will help.  Bruising on the skin over the port. This will subside in 3-4 days. HOME CARE INSTRUCTIONS  After your port is placed, you will get a manufacturer's information card. The card has information about your port. Keep this card with you at all times.   Know what kind of port you have. There are many types of ports available.   Wear a medical alert bracelet in case of an emergency. This can help alert health care workers that you have a port.   The port can stay in for as long as your health care provider believes it is necessary.   A home health care nurse may give medicines and take care of the port.   You or a family member can get special training and directions for giving medicine and taking care of the port at home.  SEEK MEDICAL CARE IF:   Your port does not flush or you are unable to get a blood return.   You have a fever or chills. SEEK IMMEDIATE MEDICAL CARE IF:  You have new fluid or pus coming from your incision.   You notice a bad smell coming from your incision site.   You have swelling, pain, or more redness at the incision or port site.   You have chest pain or shortness of breath. This  information is not intended to replace advice given to you by your health care provider. Make sure you discuss any questions you have with your health care provider. Document Released: 03/17/2013 Document Revised: 06/01/2013 Document Reviewed: 03/17/2013 Elsevier Interactive Patient Education  2017 [place].  Moderate Conscious Sedation, Adult, Care After These instructions provide you with information about caring for yourself after your procedure. Your health care provider may also give you more specific instructions. Your treatment has been planned according to current medical practices, but problems sometimes occur. Call your health care provider if you have any problems or questions after your procedure. What can I expect after the procedure? After your procedure, it is common:  To feel sleepy for several hours.  To feel clumsy and have poor balance for several hours.  To have poor judgment for several hours.  To vomit if you eat too soon. Follow these instructions at home: For at least 24 hours after the procedure:   Do not:  Participate in activities where you could fall or become injured.  Drive.  Use heavy machinery.  Drink alcohol.  Take sleeping pills or medicines that cause drowsiness.  Make important decisions or sign legal documents.  Take care of children on your own.  Rest. Eating and drinking  Follow the diet recommended by your health care provider.  If you vomit:  Drink water, juice, or soup when you can drink without vomiting.  Make sure you have little or no nausea before eating solid foods. General instructions  Have a responsible adult stay with you until you are awake and alert.  Take over-the-counter and prescription medicines only as told by your health care provider.  If you smoke, do not smoke without supervision.  Keep all follow-up visits as told by your health care provider. This is important. Contact a health care provider  if:  You keep feeling nauseous or you keep vomiting.  You feel light-headed.  You develop a rash.  You have a fever. Get help right away if:  You have trouble breathing. This information is not intended to replace advice given to you by your health care provider. Make sure you discuss any questions you have with your health care provider. Document Released: 03/17/2013 Document Revised: 10/30/2015 Document Reviewed: 09/16/2015 Elsevier Interactive Patient Education  2017 [employer].

## 2016-04-29 NOTE — Consult Note (Signed)
Chief Complaint: Patient was seen in consultation today for Port-A-Cath placement  Referring Physician(s): Mohamed,Mohamed  Supervising Physician: Arne Cleveland  Patient Status: [facility] - Out-pt  History of Present Illness: Tim Turner is a 75 y.o. 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 He presents today for Port-A-Cath placement for palliative chemotherapy.  Past Medical History:  Diagnosis Date  . Arthritis   . Diabetes mellitus without complication ([place])   . Diabetic neuropathy ([place]) 04/24/2016  . History of prostate cancer   . Hypercholesteremia   . Hypertension   . Sleep apnea     Past Surgical History:  Procedure Laterality Date  . BACK SURGERY  1985   nerve decompression  . HERNIA REPAIR  2007   2 surgeries  . PROSTATE SURGERY  2008  . VIDEO BRONCHOSCOPY Bilateral 04/11/2016   Procedure: VIDEO BRONCHOSCOPY WITHOUT FLUORO;  Surgeon: Tanda Rockers, MD;  Location: [facility];  Service: Cardiopulmonary;  Laterality: Bilateral;    Allergies: Morphine and related  Medications: Prior to Admission medications   Medication Sig Start Date End Date Taking? Authorizing Provider  amLODipine (NORVASC) 10 MG tablet Take 10 mg by mouth daily.  03/04/14   Historical Provider, MD  citalopram (CELEXA) 20 MG tablet Take 20 mg by mouth daily.  04/13/14   Historical Provider, MD  famotidine (PEPCID) 20 MG tablet TAKE 1 TABLET BY MOUTH AT BEDTIME 01/22/16   Tanda Rockers, MD  glipiZIDE (GLUCOTROL XL) 10 MG 24 hr tablet Take 10 mg by mouth daily with breakfast.    Historical Provider, MD  ipratropium-albuterol (DUONEB) 0.5-2.5 (3) MG/3ML SOLN Take 3 mLs by nebulization every 6 (six) hours as needed.    Historical Provider, MD  lidocaine-prilocaine (EMLA) cream Apply 1 application topically as needed. 04/24/16   Curt Bears, MD  losartan (COZAAR) 50 MG tablet Take 50 mg by  mouth daily.  03/24/14   Historical Provider, MD  meperidine (DEMEROL) 50 MG tablet Take 1 tablet (50 mg total) by mouth every 4 (four) hours as needed for severe pain. 04/19/16   Tanda Rockers, MD  metFORMIN (GLUCOPHAGE-XR) 500 MG 24 hr tablet Take 1,000 mg by mouth daily with breakfast.  03/15/14   Historical Provider, MD  methylphenidate (RITALIN) 10 MG tablet 1 twice daily as needed 01/01/16   Deneise Lever, MD  metoprolol tartrate (LOPRESSOR) 25 MG tablet Take 25 mg by mouth 2 (two) times daily.    Historical Provider, MD  pantoprazole (PROTONIX) 40 MG tablet Take 1 tablet (40 mg total) by mouth 2 (two) times daily before a meal. 04/19/16   Tanda Rockers, MD  pravastatin (PRAVACHOL) 40 MG tablet  02/19/16   Historical Provider, MD  prochlorperazine (COMPAZINE) 10 MG tablet Take 1 tablet (10 mg total) by mouth every 6 (six) hours as needed for nausea or vomiting. 04/24/16   Curt Bears, MD     Family History  Problem Relation Age of Onset  . Heart disease Mother   . Breast cancer Mother   . Heart disease Father     Social History   Social History  . Marital status: Married    Spouse name: N/A  . Number of children: N/A  . Years of education: N/A   Social History Main Topics  . Smoking status: Former Smoker    Packs/day: 4.00    Years: 35.00    Types:  Cigarettes    Quit date: 05/02/1985  . Smokeless tobacco: Never Used  . Alcohol use No  . Drug use: No  . Sexual activity: Not on file   Other Topics Concern  . Not on file   Social History Narrative  . No narrative on file      Review of Systems currently denies fever, headache, chest pain, abdominal pain, back pain, nausea, vomiting or abnormal bleeding. He does have dyspnea, occasional cough, hard of hearing, prior hemoptysis.  Vital Signs: BP (!) 158/76 (BP Location: Left Arm)   Pulse 79   Temp 98.1 F (36.7 C) (Oral)   Resp 18   SpO2 96%   Physical Exam patient awake, alert. Chest with clear breath  sounds bilaterally. Heart with regular rate and rhythm, question soft murmur. Abdomen obese, soft, positive bowel sounds, nontender. Trace bilateral lower extremity edema  Mallampati Score:     Imaging: Nm Pet Image Initial (pi) Skull Base To Thigh  Result Date: 04/23/2016 CLINICAL DATA:  Initial treatment strategy for right upper lobe mass with peripheral collapse. EXAM: NUCLEAR MEDICINE PET SKULL BASE TO THIGH TECHNIQUE: 12.2 mCi F-18 FDG was injected intravenously. Full-ring PET imaging was performed from the skull base to thigh after the radiotracer. CT data was obtained and used for attenuation correction and anatomic localization. FASTING BLOOD GLUCOSE:  Value: 135 mg/dl COMPARISON:  Chest CT on 03/27/2016 FINDINGS: NECK No hypermetabolic lymph nodes in the neck. CHEST A central right upper lobe mass is seen which obstructs the central right upper lobe bronchus and is contiguous with the right hilum. This measures 6.1 x 5.7 cm on image 75/4, and has SUV max of 13.4. This mass causes postobstructive right upper lobe collapse. Hypermetabolic mediastinal lymphadenopathy is seen in the lateral aortic, right paratracheal, and subcarinal regions, with index lymph node in the right paratracheal region measuring 2.1 cm short axis on image 69/4, with SUV max of 6.2. Hypermetabolic bilateral hilar lymphadenopathy is also demonstrated with index lymph node in the left hilum measuring 2.3 cm on image 89/4, with SUV max of 5.8. Several hypermetabolic pulmonary nodules are seen in the left lower lobe, some of which are new or mildly increased in size. New nodule in the anterior left lower lobe measures 1.6 cm on image 98/4 and is hypermetabolic, although this cannot be accurately measured due to proximity to hyper metabolic left hilar lymphadenopathy. Another new 17 mm pulmonary nodule in posterior left lower lobe on image 108/4 has SUV max of 4.4. A 9 mm pulmonary nodule is seen in the right middle lobe on image  79/4 which is mildly increased in size from 7 mm on previous study. This shows low-grade metabolic activity with SUV max of 1.9. No evidence of pleural effusion. Bilateral pleural plaque is seen with only minimal calcification of some plaque in the anterior left hemithorax. This shows absence of metabolic activity. ABDOMEN/PELVIS No abnormal hypermetabolic activity within the liver, pancreas, adrenal glands, or spleen. No hypermetabolic lymph nodes in the abdomen or pelvis. Left upper pole renal cyst shows absence of hypermetabolic activity. Aortic atherosclerosis. Colonic diverticulosis noted. Surgical mesh noted in suprapubic anterior abdominal wall. Penile prosthesis noted with reservoir in the left pelvis. SKELETON No focal hypermetabolic activity to suggest skeletal metastasis. IMPRESSION: 6 cm hypermetabolic central right upper lobe mass obstructing the right upper lobe bronchus, with postobstructive collapse. This is consistent with primary bronchogenic carcinoma. Hypermetabolic bilateral mediastinal and bilateral hilar lymphadenopathy, consistent with metastatic disease. Increased number and size of  several hypermetabolic pulmonary nodules in left lower lobe and right upper lobe, highly suspicious for pulmonary metastases. No evidence of extra thoracic metastatic disease. Electronically Signed   By: Earle Gell M.D.   On: 04/23/2016 09:42    Labs:  CBC:  Recent Labs  03/22/16 1504 04/24/16 1334  WBC 7.2 6.8  HGB 15.1 14.9  HCT 44.2 43.9  PLT 215.0 155    COAGS: No results for input(s): INR, APTT in the last 8760 hours.  BMP:  Recent Labs  03/22/16 1504 04/24/16 1334  NA 137 138  K 4.4 4.2  CL 103  --   CO2 27 23  GLUCOSE 147* 144*  BUN 13 12.2  CALCIUM 9.5 8.9  CREATININE 0.99 1.0    LIVER FUNCTION TESTS:  Recent Labs  04/24/16 1334  BILITOT 0.77  AST 23  ALT 29  ALKPHOS 62  PROT 7.1  ALBUMIN 3.5    TUMOR MARKERS: No results for input(s): AFPTM, CEA, CA199,  CHROMGRNA in the last 8760 hours.  Assessment and Plan:  75 y.o. 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 He presents today for Port-A-Cath placement for palliative chemotherapy.Risks and benefits discussed with the patient/wife including, but not limited to bleeding, infection, pneumothorax, or fibrin sheath development and need for additional procedures.All of the patient's questions were answered, patient is agreeable to proceed.Consent signed and in chart.     Thank you for this interesting consult.  I greatly enjoyed meeting Tim Turner and look forward to participating in their care.  A copy of this report was sent to the requesting provider on this date.  Electronically Signed: D. Rowe Robert 04/29/2016, 11:54 AM   I spent a total of 25 minutes  in face to face in clinical consultation, greater than 50% of which was counseling/coordinating care for port a cath placement

## 2016-04-29 NOTE — Progress Notes (Signed)
Oncology Nurse Navigator Documentation  Oncology Nurse Navigator Flowsheets 04/29/2016  Navigator Location [facility]  Navigator Encounter Type Other/pateint wants to be treated at [facility] cancer center.  She is set up for [facility] tomorrow and has seen med onc.  I faxed notes from Dr. Julien Nordmann, scan results, and pathology to [facility].  I updated Dr. Julien Nordmann.   Treatment Phase Pre-Tx/Tx Discussion  Barriers/Navigation Needs Coordination of Care  Interventions Coordination of Care  Coordination of Care Appts  Acuity Level 2  Acuity Level 2 Assistance expediting appointments  Time Spent with Patient 11

## 2016-04-29 NOTE — Procedures (Addendum)
LEFT  IJ Port cathter placement with [place] and fluoroscopy No complication No blood loss. See complete dictation in [facility].

## 2016-04-30 ENCOUNTER — Other Ambulatory Visit: Payer: Medicare Other

## 2016-04-30 ENCOUNTER — Encounter (HOSPITAL_COMMUNITY): Payer: Self-pay

## 2016-05-01 ENCOUNTER — Other Ambulatory Visit: Payer: Medicare Other

## 2016-05-01 ENCOUNTER — Ambulatory Visit: Payer: Medicare Other

## 2016-05-06 ENCOUNTER — Ambulatory Visit: Payer: Medicare Other

## 2016-05-06 ENCOUNTER — Ambulatory Visit: Admission: RE | Admit: 2016-05-06 | Payer: Medicare Other | Source: Ambulatory Visit | Admitting: Radiation Oncology

## 2016-05-06 ENCOUNTER — Encounter: Payer: Self-pay | Admitting: Radiation Oncology

## 2016-05-08 ENCOUNTER — Ambulatory Visit: Payer: Medicare Other

## 2016-05-08 ENCOUNTER — Ambulatory Visit: Payer: Medicare Other | Admitting: Internal Medicine

## 2016-05-08 ENCOUNTER — Other Ambulatory Visit: Payer: Medicare Other

## 2016-05-14 DIAGNOSIS — C3411 Malignant neoplasm of upper lobe, right bronchus or lung: Secondary | ICD-10-CM | POA: Diagnosis not present

## 2016-05-22 ENCOUNTER — Ambulatory Visit: Payer: Medicare Other

## 2016-05-22 ENCOUNTER — Other Ambulatory Visit: Payer: Medicare Other

## 2016-05-27 ENCOUNTER — Telehealth: Payer: Self-pay | Admitting: Internal Medicine

## 2016-05-27 NOTE — Telephone Encounter (Signed)
lmtcb

## 2016-05-28 NOTE — Telephone Encounter (Signed)
Pt wife calling back a/b pt med request she can be reached @ [phone].Hillery Hunter

## 2016-05-28 NOTE — Telephone Encounter (Signed)
Sorry I didn't understand the circumstances but whoever is managing his case now should be willing and able to fill this med, otherwise we can do one more refill s ov

## 2016-05-28 NOTE — Telephone Encounter (Signed)
Called and spoke with pts wife and she stated that MW had told them that if they needed a refill to let him know.  She stated that the pt has been using this at night sometimes to help since has been taking the radiation.  pts wife was offered the appt tomorrow with MW, but she declined due to the pt being very tired with radiation.  She stated that they will call and see if the doctor there will refill this.  Will forward back to MW to make him aware.

## 2016-05-28 NOTE — Telephone Encounter (Signed)
Spoke with pt's wife they are requesting refill for generic demerol given at 04/19/16.  Please advise if ok for refill. Protonix 40 mg Take 30- 60 min before your first and last meals of the day    Take delsym two tsp every 12 hours and supplement if needed with  demerol 50 mg up to 1-2 every 4 hours to suppress the urge to cough. Swallowing water or using ice chips/non mint and menthol containing candies (such as lifesavers or sugarless jolly ranchers) are also effective.  You should rest your voice and avoid activities that you know make you cough.   Once you have eliminated the cough for 3 straight days try reducing the demerol first,  then the delsym as tolerated.     Pulmonary follow up is as needed

## 2016-05-28 NOTE — Telephone Encounter (Signed)
Can't do that s ov with all active meds including otcs in hand as had hoped he would be 100% better by now  You can tell him to come at 130 tomorrow 05/29/16 as I will be starting my clinic early and can see him then

## 2016-05-29 ENCOUNTER — Ambulatory Visit: Payer: Medicare Other

## 2016-05-29 ENCOUNTER — Other Ambulatory Visit: Payer: Medicare Other

## 2016-06-11 DIAGNOSIS — J069 Acute upper respiratory infection, unspecified: Secondary | ICD-10-CM | POA: Diagnosis not present

## 2016-06-11 DIAGNOSIS — C3411 Malignant neoplasm of upper lobe, right bronchus or lung: Secondary | ICD-10-CM | POA: Diagnosis not present

## 2016-06-11 DIAGNOSIS — D696 Thrombocytopenia, unspecified: Secondary | ICD-10-CM | POA: Diagnosis not present

## 2016-06-12 ENCOUNTER — Ambulatory Visit: Payer: Medicare Other

## 2016-06-12 ENCOUNTER — Other Ambulatory Visit: Payer: Medicare Other

## 2016-06-19 ENCOUNTER — Other Ambulatory Visit: Payer: Medicare Other

## 2016-06-19 ENCOUNTER — Ambulatory Visit: Payer: Medicare Other

## 2016-07-02 DIAGNOSIS — D61810 Antineoplastic chemotherapy induced pancytopenia: Secondary | ICD-10-CM

## 2016-07-02 DIAGNOSIS — C3411 Malignant neoplasm of upper lobe, right bronchus or lung: Secondary | ICD-10-CM | POA: Diagnosis not present

## 2016-07-03 ENCOUNTER — Other Ambulatory Visit: Payer: Medicare Other

## 2016-07-03 ENCOUNTER — Ambulatory Visit: Payer: Medicare Other

## 2016-07-09 DIAGNOSIS — C3411 Malignant neoplasm of upper lobe, right bronchus or lung: Secondary | ICD-10-CM | POA: Diagnosis not present

## 2016-07-10 ENCOUNTER — Other Ambulatory Visit: Payer: Medicare Other

## 2016-07-10 ENCOUNTER — Ambulatory Visit: Payer: Medicare Other

## 2016-07-20 ENCOUNTER — Other Ambulatory Visit: Payer: Self-pay | Admitting: Internal Medicine

## 2016-07-30 DIAGNOSIS — C3491 Malignant neoplasm of unspecified part of right bronchus or lung: Secondary | ICD-10-CM | POA: Diagnosis not present

## 2016-09-10 DIAGNOSIS — D649 Anemia, unspecified: Secondary | ICD-10-CM | POA: Diagnosis not present

## 2016-09-10 DIAGNOSIS — R05 Cough: Secondary | ICD-10-CM | POA: Diagnosis not present

## 2016-09-10 DIAGNOSIS — R5383 Other fatigue: Secondary | ICD-10-CM | POA: Diagnosis not present

## 2016-09-10 DIAGNOSIS — Z87891 Personal history of nicotine dependence: Secondary | ICD-10-CM | POA: Diagnosis not present

## 2016-09-10 DIAGNOSIS — C61 Malignant neoplasm of prostate: Secondary | ICD-10-CM | POA: Diagnosis not present

## 2016-09-10 DIAGNOSIS — I10 Essential (primary) hypertension: Secondary | ICD-10-CM | POA: Diagnosis not present

## 2016-09-10 DIAGNOSIS — C3411 Malignant neoplasm of upper lobe, right bronchus or lung: Secondary | ICD-10-CM | POA: Diagnosis not present

## 2016-09-10 DIAGNOSIS — E119 Type 2 diabetes mellitus without complications: Secondary | ICD-10-CM | POA: Diagnosis not present

## 2016-11-06 DIAGNOSIS — C3411 Malignant neoplasm of upper lobe, right bronchus or lung: Secondary | ICD-10-CM | POA: Diagnosis not present

## 2016-11-06 DIAGNOSIS — D61818 Other pancytopenia: Secondary | ICD-10-CM | POA: Diagnosis not present

## 2016-11-06 DIAGNOSIS — L988 Other specified disorders of the skin and subcutaneous tissue: Secondary | ICD-10-CM | POA: Diagnosis not present

## 2017-01-09 DIAGNOSIS — C3411 Malignant neoplasm of upper lobe, right bronchus or lung: Secondary | ICD-10-CM | POA: Diagnosis not present

## 2017-01-09 DIAGNOSIS — R05 Cough: Secondary | ICD-10-CM | POA: Diagnosis not present

## 2017-01-09 DIAGNOSIS — R0600 Dyspnea, unspecified: Secondary | ICD-10-CM | POA: Diagnosis not present

## 2017-01-16 DIAGNOSIS — C7800 Secondary malignant neoplasm of unspecified lung: Secondary | ICD-10-CM | POA: Diagnosis not present

## 2017-01-16 DIAGNOSIS — C771 Secondary and unspecified malignant neoplasm of intrathoracic lymph nodes: Secondary | ICD-10-CM

## 2017-01-16 DIAGNOSIS — C3411 Malignant neoplasm of upper lobe, right bronchus or lung: Secondary | ICD-10-CM | POA: Diagnosis not present

## 2017-01-19 ENCOUNTER — Other Ambulatory Visit: Payer: Self-pay | Admitting: Internal Medicine

## 2017-01-30 DIAGNOSIS — D61810 Antineoplastic chemotherapy induced pancytopenia: Secondary | ICD-10-CM | POA: Diagnosis not present

## 2017-01-30 DIAGNOSIS — C3411 Malignant neoplasm of upper lobe, right bronchus or lung: Secondary | ICD-10-CM | POA: Diagnosis not present

## 2017-01-30 DIAGNOSIS — C771 Secondary and unspecified malignant neoplasm of intrathoracic lymph nodes: Secondary | ICD-10-CM | POA: Diagnosis not present

## 2017-01-30 DIAGNOSIS — R05 Cough: Secondary | ICD-10-CM | POA: Diagnosis not present

## 2017-01-30 DIAGNOSIS — C7800 Secondary malignant neoplasm of unspecified lung: Secondary | ICD-10-CM | POA: Diagnosis not present

## 2017-02-08 ENCOUNTER — Other Ambulatory Visit: Payer: Self-pay | Admitting: Internal Medicine

## 2017-03-13 DIAGNOSIS — C771 Secondary and unspecified malignant neoplasm of intrathoracic lymph nodes: Secondary | ICD-10-CM | POA: Diagnosis not present

## 2017-03-13 DIAGNOSIS — C3411 Malignant neoplasm of upper lobe, right bronchus or lung: Secondary | ICD-10-CM | POA: Diagnosis not present

## 2017-03-13 DIAGNOSIS — C7800 Secondary malignant neoplasm of unspecified lung: Secondary | ICD-10-CM | POA: Diagnosis not present

## 2017-03-20 DIAGNOSIS — Z0001 Encounter for general adult medical examination with abnormal findings: Secondary | ICD-10-CM

## 2017-03-26 DIAGNOSIS — R55 Syncope and collapse: Secondary | ICD-10-CM | POA: Diagnosis not present

## 2017-03-26 DIAGNOSIS — I10 Essential (primary) hypertension: Secondary | ICD-10-CM | POA: Diagnosis not present

## 2017-03-26 DIAGNOSIS — E119 Type 2 diabetes mellitus without complications: Secondary | ICD-10-CM | POA: Diagnosis not present

## 2017-03-26 DIAGNOSIS — R0789 Other chest pain: Secondary | ICD-10-CM

## 2017-03-27 DIAGNOSIS — I10 Essential (primary) hypertension: Secondary | ICD-10-CM | POA: Diagnosis not present

## 2017-03-27 DIAGNOSIS — B952 Enterococcus as the cause of diseases classified elsewhere: Secondary | ICD-10-CM

## 2017-03-27 DIAGNOSIS — R55 Syncope and collapse: Secondary | ICD-10-CM | POA: Diagnosis not present

## 2017-03-27 DIAGNOSIS — E119 Type 2 diabetes mellitus without complications: Secondary | ICD-10-CM | POA: Diagnosis not present

## 2017-03-27 DIAGNOSIS — C3411 Malignant neoplasm of upper lobe, right bronchus or lung: Secondary | ICD-10-CM | POA: Diagnosis not present

## 2017-03-27 DIAGNOSIS — R0789 Other chest pain: Secondary | ICD-10-CM | POA: Diagnosis not present

## 2017-03-27 DIAGNOSIS — D6481 Anemia due to antineoplastic chemotherapy: Secondary | ICD-10-CM | POA: Diagnosis not present

## 2017-03-27 DIAGNOSIS — N390 Urinary tract infection, site not specified: Secondary | ICD-10-CM | POA: Diagnosis not present

## 2017-03-27 DIAGNOSIS — R05 Cough: Secondary | ICD-10-CM

## 2017-03-28 DIAGNOSIS — N390 Urinary tract infection, site not specified: Secondary | ICD-10-CM | POA: Diagnosis not present

## 2017-03-28 DIAGNOSIS — B952 Enterococcus as the cause of diseases classified elsewhere: Secondary | ICD-10-CM

## 2017-03-28 DIAGNOSIS — R55 Syncope and collapse: Secondary | ICD-10-CM | POA: Diagnosis not present

## 2017-03-28 DIAGNOSIS — I10 Essential (primary) hypertension: Secondary | ICD-10-CM | POA: Diagnosis not present

## 2017-03-28 DIAGNOSIS — E119 Type 2 diabetes mellitus without complications: Secondary | ICD-10-CM | POA: Diagnosis not present

## 2017-03-28 DIAGNOSIS — D696 Thrombocytopenia, unspecified: Secondary | ICD-10-CM

## 2017-03-28 DIAGNOSIS — R0789 Other chest pain: Secondary | ICD-10-CM | POA: Diagnosis not present

## 2017-03-28 DIAGNOSIS — D649 Anemia, unspecified: Secondary | ICD-10-CM | POA: Diagnosis not present

## 2017-03-28 DIAGNOSIS — C3411 Malignant neoplasm of upper lobe, right bronchus or lung: Secondary | ICD-10-CM | POA: Diagnosis not present

## 2017-03-28 DIAGNOSIS — J209 Acute bronchitis, unspecified: Secondary | ICD-10-CM | POA: Diagnosis not present

## 2017-04-01 DIAGNOSIS — C771 Secondary and unspecified malignant neoplasm of intrathoracic lymph nodes: Secondary | ICD-10-CM | POA: Diagnosis not present

## 2017-04-01 DIAGNOSIS — R42 Dizziness and giddiness: Secondary | ICD-10-CM | POA: Diagnosis not present

## 2017-04-01 DIAGNOSIS — C7800 Secondary malignant neoplasm of unspecified lung: Secondary | ICD-10-CM | POA: Diagnosis not present

## 2017-04-01 DIAGNOSIS — C3411 Malignant neoplasm of upper lobe, right bronchus or lung: Secondary | ICD-10-CM | POA: Diagnosis not present

## 2017-04-08 DIAGNOSIS — C7802 Secondary malignant neoplasm of left lung: Secondary | ICD-10-CM | POA: Diagnosis not present

## 2017-04-08 DIAGNOSIS — J209 Acute bronchitis, unspecified: Secondary | ICD-10-CM | POA: Diagnosis not present

## 2017-04-08 DIAGNOSIS — C771 Secondary and unspecified malignant neoplasm of intrathoracic lymph nodes: Secondary | ICD-10-CM | POA: Diagnosis not present

## 2017-04-08 DIAGNOSIS — C7801 Secondary malignant neoplasm of right lung: Secondary | ICD-10-CM | POA: Diagnosis not present

## 2017-04-08 DIAGNOSIS — C3411 Malignant neoplasm of upper lobe, right bronchus or lung: Secondary | ICD-10-CM | POA: Diagnosis not present

## 2017-04-14 DIAGNOSIS — C3411 Malignant neoplasm of upper lobe, right bronchus or lung: Secondary | ICD-10-CM | POA: Diagnosis not present

## 2017-04-14 DIAGNOSIS — D509 Iron deficiency anemia, unspecified: Secondary | ICD-10-CM | POA: Diagnosis not present

## 2017-04-14 DIAGNOSIS — J209 Acute bronchitis, unspecified: Secondary | ICD-10-CM | POA: Diagnosis not present

## 2017-04-14 DIAGNOSIS — C771 Secondary and unspecified malignant neoplasm of intrathoracic lymph nodes: Secondary | ICD-10-CM | POA: Diagnosis not present

## 2017-04-14 DIAGNOSIS — C7800 Secondary malignant neoplasm of unspecified lung: Secondary | ICD-10-CM | POA: Diagnosis not present

## 2017-04-16 DIAGNOSIS — C7801 Secondary malignant neoplasm of right lung: Secondary | ICD-10-CM | POA: Diagnosis not present

## 2017-04-16 DIAGNOSIS — J918 Pleural effusion in other conditions classified elsewhere: Secondary | ICD-10-CM | POA: Diagnosis not present

## 2017-04-16 DIAGNOSIS — C7802 Secondary malignant neoplasm of left lung: Secondary | ICD-10-CM | POA: Diagnosis not present

## 2017-04-16 DIAGNOSIS — C3411 Malignant neoplasm of upper lobe, right bronchus or lung: Secondary | ICD-10-CM | POA: Diagnosis not present

## 2017-04-16 DIAGNOSIS — C771 Secondary and unspecified malignant neoplasm of intrathoracic lymph nodes: Secondary | ICD-10-CM | POA: Diagnosis not present

## 2017-04-22 DIAGNOSIS — C771 Secondary and unspecified malignant neoplasm of intrathoracic lymph nodes: Secondary | ICD-10-CM | POA: Diagnosis not present

## 2017-04-22 DIAGNOSIS — C7802 Secondary malignant neoplasm of left lung: Secondary | ICD-10-CM | POA: Diagnosis not present

## 2017-04-22 DIAGNOSIS — C3411 Malignant neoplasm of upper lobe, right bronchus or lung: Secondary | ICD-10-CM | POA: Diagnosis not present

## 2017-04-22 DIAGNOSIS — C7801 Secondary malignant neoplasm of right lung: Secondary | ICD-10-CM | POA: Diagnosis not present

## 2017-05-21 DIAGNOSIS — C771 Secondary and unspecified malignant neoplasm of intrathoracic lymph nodes: Secondary | ICD-10-CM | POA: Diagnosis not present

## 2017-05-21 DIAGNOSIS — C3411 Malignant neoplasm of upper lobe, right bronchus or lung: Secondary | ICD-10-CM | POA: Diagnosis not present

## 2017-05-21 DIAGNOSIS — D509 Iron deficiency anemia, unspecified: Secondary | ICD-10-CM | POA: Diagnosis not present

## 2017-05-21 DIAGNOSIS — C7801 Secondary malignant neoplasm of right lung: Secondary | ICD-10-CM | POA: Diagnosis not present

## 2017-05-21 DIAGNOSIS — C7802 Secondary malignant neoplasm of left lung: Secondary | ICD-10-CM | POA: Diagnosis not present

## 2017-05-21 DIAGNOSIS — R05 Cough: Secondary | ICD-10-CM | POA: Diagnosis not present

## 2017-05-29 DIAGNOSIS — C7802 Secondary malignant neoplasm of left lung: Secondary | ICD-10-CM | POA: Diagnosis not present

## 2017-05-29 DIAGNOSIS — C3411 Malignant neoplasm of upper lobe, right bronchus or lung: Secondary | ICD-10-CM | POA: Diagnosis not present

## 2017-05-29 DIAGNOSIS — D509 Iron deficiency anemia, unspecified: Secondary | ICD-10-CM | POA: Diagnosis not present

## 2017-05-29 DIAGNOSIS — C771 Secondary and unspecified malignant neoplasm of intrathoracic lymph nodes: Secondary | ICD-10-CM | POA: Diagnosis not present

## 2017-05-29 DIAGNOSIS — C7801 Secondary malignant neoplasm of right lung: Secondary | ICD-10-CM | POA: Diagnosis not present

## 2017-05-29 DIAGNOSIS — Z9221 Personal history of antineoplastic chemotherapy: Secondary | ICD-10-CM | POA: Diagnosis not present

## 2017-06-12 DIAGNOSIS — C771 Secondary and unspecified malignant neoplasm of intrathoracic lymph nodes: Secondary | ICD-10-CM | POA: Diagnosis not present

## 2017-06-12 DIAGNOSIS — C7802 Secondary malignant neoplasm of left lung: Secondary | ICD-10-CM | POA: Diagnosis not present

## 2017-06-12 DIAGNOSIS — C7801 Secondary malignant neoplasm of right lung: Secondary | ICD-10-CM | POA: Diagnosis not present

## 2017-06-12 DIAGNOSIS — C3411 Malignant neoplasm of upper lobe, right bronchus or lung: Secondary | ICD-10-CM | POA: Diagnosis not present

## 2017-06-12 DIAGNOSIS — Z862 Personal history of diseases of the blood and blood-forming organs and certain disorders involving the immune mechanism: Secondary | ICD-10-CM | POA: Diagnosis not present

## 2017-06-17 ENCOUNTER — Encounter: Payer: Self-pay | Admitting: Internal Medicine

## 2017-06-17 ENCOUNTER — Ambulatory Visit: Payer: Medicare Other | Admitting: Internal Medicine

## 2017-06-17 VITALS — BP 112/70 | HR 98 | Ht 70.0 in | Wt 195.0 lb

## 2017-06-17 DIAGNOSIS — R053 Chronic cough: Secondary | ICD-10-CM

## 2017-06-17 DIAGNOSIS — R05 Cough: Secondary | ICD-10-CM | POA: Diagnosis not present

## 2017-06-17 DIAGNOSIS — J9611 Chronic respiratory failure with hypoxia: Secondary | ICD-10-CM

## 2017-06-17 DIAGNOSIS — R0609 Other forms of dyspnea: Secondary | ICD-10-CM | POA: Diagnosis not present

## 2017-06-17 MED ORDER — MEPERIDINE HCL 50 MG PO TABS: ORAL_TABLET | ORAL | 0 refills | Status: DC

## 2017-06-17 MED ORDER — GABAPENTIN 100 MG PO CAPS: 100.0000 mg | ORAL_CAPSULE | Freq: Three times a day (TID) | ORAL | 2 refills | Status: DC

## 2017-06-17 NOTE — Progress Notes (Signed)
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 Advice Only    self referral-pt recently hospitalized at [facility] for pna, was told to establish with Pulm for b/l plaques on lower lobes.    developed chronic min prod  Cough x 1-2 tsp clear mucus x around a decade prior to OV   esp in am's p stirring and some better rest the day but never really gone assoc with doe x parking lot or two flights Around Apr 11 2015  much more cough more production yellow mucus/ low grade admitted [facility] 11/6-11/16 for CAP cough is some better, fever gone energy not better and sleepy a lot more than usual despite use of cpap and has not notified Dr Tim Turner and wishes a second opinion. 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 rec Do not drive a truck if you are sleepy at all  Pantoprazole (protonix) 40 mg   Take  30-60 min before first meal of the day and Pepcid (famotidine)  20 mg one @  bedtime until return to office - this is the best way to tell whether stomach acid is contributing to your problem.   GERD  Diet         07/04/2015  f/u ov/Tim Turner re: cough/ osa  Chief Complaint  Patient presents with  . Follow-up    PFT and CXR done today. Pt c/o SOB for the past wk- walking "not very far". He also has some prod cough with clear sputum. He has been using neb 1-2 x per day on average for the past wk.    he states he was feeling much better until he caught a cold  over the last week but feels his symptoms are well controlled with the use of albuterol. rec mucinex dm up to 1200 mg every 12 hours as needed for cough  GERD  Work on weight loss  F/u 6 months: did not happen    03/13/16 NP eval Tim Turner re flare of chronic cough ? CAP  rec  CXR > ? pna > rx doxy  We will schedule you for a swallow study. Follow the GERD diet. Continue Pantoprazole (protonix) 40 mg   Take  30-60 min before first meal of the day and Pepcid (famotidine)  20 mg one @  bedtime  Avoid mint, menthol and chocolate. Sips of water instead of throat clearing. Sugar free jolly ranchers for throat  Soothing. Delsym 1 teaspoon every 12 hours for cough.  CXR 03/13/2016:>> New opacity in the right upper lobe extending to the right apex. This is likely pneumonia. Neoplastic disease is possible.       03/22/2016  f/u ov/Tim Turner re: cough flare ? ILD? Chief Complaint  Patient presents with  . Follow-up    Cough had improved on abx and then worsened  again once he finished med. He is still wheezing and having SOB.    hx is cough x 10 years, much worse toward end of September 2017 low grade fever, clear mucus Nebulizer helps some but hasn't used in several weeks  Cough really only improved while on cough suppression rec - FOB 04/11/2016 exophytic mass completely obst RUL > Sq cell ca > rec PET/ Oncology eval     04/19/2016  f/u ov/Tim Turner re: cough x 10 years / new dx RUL obst with sq cell ca  Chief Complaint  Patient presents with  . Follow-up    Pt. states his breathing has remained unchanged, stilling coughing it has not changed  cough worse in evening p ppi each am but no hemoptysis rec Protonix 40 mg Take 30- 60 min before your first and last meals of the day  Take delsym two tsp every 12 hours and supplement if needed with  demerol 50 mg up to 1-2 every 4 hours to suppress the urge to cough.   rec Protonix 40 mg Take 30- 60 min before your first and last meals of the day  Take  delsym two tsp every 12 hours and supplement if needed with  demerol 50 mg up to 1-2 every 4 hours to suppress the urge to cough.  Once you have eliminated the cough for 3 straight days try reducing the demerol first,  then the delsym as tolerated.   Pulmonary follow up is as needed       06/17/2017  f/u ov/Tim Turner re:  Cough x 11 years no change baseline before or after rx for lung ca "they've done all they can or plan to" in [place]  Patient presents with  . Follow-up    cough continuously (productive), shortness of breath, wears 2 L O2   not clear used  demerol as instructed but records indicate he req refills w/in a month and rec rtc which did not do  Takes one tramadol 50 mg one at bedtime and it helps but then two makes him "crazy" and can't sleep  During the day feels the tessilon and wife's recipe honey lemon juice and olive oil work the best  Doe = MMRC3 = can't walk 100 yards even at a slow pace at a flat grade s stopping due to sob  Even on 02  Neb ? Helps sob / not cough  last used one day prior to OV - no better with prednisone in past   Mucus is minimal in am and mucoid   No obvious day to day or daytime variability or assoc  purulent sputum or mucus plugs or hemoptysis or cp or chest tightness, subjective wheeze or overt sinus or hb symptoms. No unusual exposure hx or h/o childhood pna/ asthma or knowledge of premature birth.  Sleeping ok on 02  without nocturnal    exacerbation  of respiratory  c/o's or need for noct saba. Also denies any obvious fluctuation of symptoms with weather or environmental changes or other aggravating or alleviating factors except as outlined above   Current Allergies, Complete Past Medical History, Past Surgical History, Family History, and Social History were reviewed in [employer] record.  ROS  The following are not active complaints unless bolded Hoarseness, sore throat, dysphagia, dental problems, itching,  sneezing,  nasal congestion or discharge of excess mucus or purulent secretions, ear ache,   fever, chills, sweats, unintended wt loss or wt gain, classically pleuritic or exertional cp,  orthopnea pnd or leg swelling, presyncope, palpitations, abdominal pain, anorexia, nausea, vomiting, diarrhea  or change in bowel habits or change in bladder habits, change in stools or change in urine, dysuria, hematuria,  rash, arthralgias, visual complaints, headache, numbness, weakness or ataxia or problems with walking or coordination,  change in mood/affect or memory.        Current Meds  Medication Sig  . amLODipine (NORVASC) 10 MG tablet Take 5 mg by mouth daily.   . benzonatate (TESSALON) 200 MG capsule Take 200 mg by mouth 3 (three) times daily.  . citalopram (CELEXA) 20 MG tablet Take 20 mg by mouth daily.   Marland Kitchen glipiZIDE (GLUCOTROL XL) 10 MG 24 hr tablet Take 5 mg by mouth daily with breakfast.   . ipratropium-albuterol (DUONEB) 0.5-2.5 (3) MG/3ML SOLN Take 3 mLs by nebulization every 6 (six) hours as needed.  . metFORMIN (GLUCOPHAGE-XR) 500 MG 24 hr tablet Take 1,000 mg by mouth daily with breakfast.   . metoprolol tartrate (LOPRESSOR) 25 MG tablet Take 25 mg by mouth 2 (two) times daily.  . pravastatin (PRAVACHOL) 40 MG tablet   .   traMADol (ULTRAM) 50 MG tablet Take 50 mg by mouth at bedtime.                         Objective:   Physical Exam  amb wm with freq throat clearing and dry hacking / sometimes shrill upper airway cough pattern  06/17/2017            195   04/19/2016       235  03/22/16 237 lb 6.4 oz (107.7 kg)  03/13/16 238 lb (108 kg)  01/01/16 232 lb 3.2 oz (105.3 kg)     Vital signs reviewed - Note on arrival 02 sats  92% on 2lpm pulsed      HEENT: nl dentition, turbinates bilaterally, and oropharynx. Nl external ear canals without cough reflex   NECK :  without JVD/Nodes/TM/ nl carotid upstrokes bilaterally   LUNGS: no acc muscle use,  Nl contour chest  which is clear to A and P bilaterally without cough on insp or exp maneuvers   CV:  RRR  no s3 or murmur or increase in P2, and no edema   ABD: mod obese but  soft and nontender with nl inspiratory excursion in the supine position. No bruits or organomegaly appreciated, bowel sounds nl  MS:  Nl gait/ ext warm without deformities, calf tenderness, cyanosis or clubbing No obvious joint restrictions   SKIN: warm and dry without lesions    NEURO:  alert, approp, nl sensorium with  no motor or cerebellar deficits apparent.           I personally reviewed images and agree with radiology impression as follows:   Chest CT 04/16/17  no progression in nodes/ met nodules/ small L effusion         Assessment & Plan:

## 2017-06-17 NOTE — Patient Instructions (Addendum)
Gabapentin 100 mg three times daily as a new maintenance medication to eliminate cough long term    Take delsym two tsp every 12 hours and supplement if needed with  Demerol  50 mg up to 1- 2 every 4 hours to suppress the urge to cough. Swallowing water or using ice chips/non mint and menthol containing candies (such as lifesavers or sugarless jolly ranchers) are also effective.  You should rest your voice and avoid activities that you know make you cough.  Once you have eliminated the cough for 3 straight days try reducing the demerol  first,  then the delsym as tolerated  Please schedule a follow up office visit in 4 weeks, sooner if needed  Add Pfts on return

## 2017-06-18 ENCOUNTER — Telehealth: Payer: Self-pay | Admitting: Internal Medicine

## 2017-06-18 ENCOUNTER — Encounter: Payer: Self-pay | Admitting: Internal Medicine

## 2017-06-18 DIAGNOSIS — R0609 Other forms of dyspnea: Secondary | ICD-10-CM

## 2017-06-18 DIAGNOSIS — J9611 Chronic respiratory failure with hypoxia: Secondary | ICD-10-CM | POA: Insufficient documentation

## 2017-06-18 MED FILL — MEPERIDINE 50 MG TABLET: 50 | 6 days supply | Qty: 40 | Fill #0

## 2017-06-18 NOTE — Assessment & Plan Note (Signed)
Adequate control on present rx, reviewed in detail with pt > no change in rx needed  = 2lpm 24/7

## 2017-06-18 NOTE — Telephone Encounter (Signed)
Per MW- needs to find another pharmacy, as this is the only thing else to try  ATC her back and the line rings busy

## 2017-06-18 NOTE — Telephone Encounter (Signed)
Called [facility] outpatient pharmacy and spoke with Aaron Edelman, who verified that they have enough of this rx to fill.  Spoke with pt's spouse, made aware.  Nothing further needed.

## 2017-06-18 NOTE — Telephone Encounter (Signed)
Spoke with the pt's spouse  She states that the pharmacy that they want to use does not have demerol and she is asking for alternative med to be sent  Please advise thanks

## 2017-06-18 NOTE — Assessment & Plan Note (Signed)
PFT's  07/04/2015  FEV1 2/35 (76 % ) ratio 77  p 2 % improvement from saba with DLCO  73 % corrects to 101 % for alv volume   - Allergy profile 03/22/2016 >  Eos 0.2 /  IgE  17  Pos RAST cat/dog/dust - trial of gabapentin 06/17/2017 along with short term cyclical cough regimen    I had an extended discussion with the patient reviewing all relevant studies completed to date and  Lasting 25  minutes of a 40 minute office visit to review his progress over the last year and re-establish with me re the following ongoing concerns:   1) although the lung ca may have contributed some to the cough, the pattern did not change much at all during the dx or treatment of the cancer so I don't believe it's the cause of the cough > 10 y duration   2) did not complete the cyclical cough regimen the last time the way it was intended   3) 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   These are the same pts (now being labeled as having "irritable larynx syndrome" by some cough centers) who not infrequently have a history of having failed to tolerate ace inhibitors,  dry powder inhalers or biphosphonates or report having atypical/extraesophageal reflux symptoms that don't respond to standard doses of PPI  and are easily confused as having aecopd or asthma flares by even experienced allergists/ pulmonologists (myself included).   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  Please see AVS for specific  Instructions which are unique to this visit and I personally typed out  which were reviewed in detail in writing with the patient and a copy provided.

## 2017-06-18 NOTE — Assessment & Plan Note (Signed)
Not clear why sob as nl pfts 07/04/15 so needs to use duoneb up to qid if helps and return with full pfts prior to duoneb next ov

## 2017-06-27 ENCOUNTER — Telehealth: Payer: Self-pay | Admitting: Internal Medicine

## 2017-06-27 NOTE — Telephone Encounter (Signed)
The intent was to use a very small # of pills  for a very short time and if he  Hasn't used it yet it probably won't work as I intended  One option is to just use case to purchase 12 pills and then return to office to regroup if this doesn't work.  He's allergic to all other alternatives per his recordd

## 2017-06-27 NOTE — Telephone Encounter (Signed)
PA request received from [facility] outpatient pharmacy for pt's Demerol 50mg  tabs.   (clinical- PA initiation form is in green PA folder)  MW please advise if you'd like to proceed with PA for pt's Demerol or if you'd like to prescribe an alternative.  Thanks.

## 2017-06-30 NOTE — Telephone Encounter (Signed)
Spoke with patient regarding medication Demerol 50mg  Pt advised Demerol 50mg  will be denied by insurance, pt will have to pay out of pocket Advised pt's wife of MW recommendations Pt advised ROV 07/16/17 and PFT prior to appt; will regroup at that time Nothing further needed at this time.

## 2017-07-15 ENCOUNTER — Other Ambulatory Visit: Payer: Self-pay | Admitting: Internal Medicine

## 2017-07-15 DIAGNOSIS — R0600 Dyspnea, unspecified: Secondary | ICD-10-CM

## 2017-07-16 ENCOUNTER — Ambulatory Visit (INDEPENDENT_AMBULATORY_CARE_PROVIDER_SITE_OTHER): Payer: Medicare Other | Admitting: Internal Medicine

## 2017-07-16 ENCOUNTER — Ambulatory Visit: Payer: Medicare Other | Admitting: Internal Medicine

## 2017-07-16 ENCOUNTER — Encounter: Payer: Self-pay | Admitting: Internal Medicine

## 2017-07-16 VITALS — BP 126/72 | HR 90 | Ht 70.0 in | Wt 192.0 lb

## 2017-07-16 DIAGNOSIS — R0600 Dyspnea, unspecified: Secondary | ICD-10-CM | POA: Diagnosis not present

## 2017-07-16 DIAGNOSIS — R05 Cough: Secondary | ICD-10-CM | POA: Diagnosis not present

## 2017-07-16 DIAGNOSIS — R0609 Other forms of dyspnea: Secondary | ICD-10-CM

## 2017-07-16 DIAGNOSIS — R053 Chronic cough: Secondary | ICD-10-CM

## 2017-07-16 LAB — PULMONARY FUNCTION TEST
DL/VA % pred: 95 %
DL/VA: 4.34 ml/min/mmHg/L
DLCO unc % pred: 28 %
DLCO unc: 8.94 ml/min/mmHg
FEF 25-75 Post: 0.72 L/sec
FEF 25-75 Pre: 1.01 L/sec
FEF2575-%CHANGE-POST: -29 %
FEF2575-%Pred-Post: 33 %
FEF2575-%Pred-Pre: 46 %
FEV1-%CHANGE-POST: -7 %
FEV1-%PRED-POST: 35 %
FEV1-%Pred-Pre: 37 %
FEV1-POST: 1.05 L
FEV1-PRE: 1.13 L
FEV1FVC-%CHANGE-POST: 5 %
FEV1FVC-%Pred-Pre: 109 %
FEV6-%Change-Post: -12 %
FEV6-%Pred-Post: 32 %
FEV6-%Pred-Pre: 37 %
FEV6-PRE: 1.44 L
FEV6-Post: 1.26 L
FEV6FVC-%PRED-PRE: 106 %
FEV6FVC-%Pred-Post: 106 %
FVC-%CHANGE-POST: -12 %
FVC-%PRED-PRE: 34 %
FVC-%Pred-Post: 30 %
FVC-Post: 1.26 L
FVC-Pre: 1.44 L
POST FEV1/FVC RATIO: 83 %
PRE FEV6/FVC RATIO: 100 %
Post FEV6/FVC ratio: 100 %
Pre FEV1/FVC ratio: 79 %
RV % PRED: 81 %
RV: 2.06 L
TLC % pred: 54 %
TLC: 3.8 L

## 2017-07-16 MED ORDER — GABAPENTIN 300 MG PO CAPS: 300.0000 mg | ORAL_CAPSULE | Freq: Three times a day (TID) | ORAL | 2 refills | Status: AC

## 2017-07-16 MED ORDER — MEPERIDINE HCL 50 MG PO TABS: ORAL_TABLET | ORAL | 0 refills | Status: AC

## 2017-07-16 NOTE — Patient Instructions (Addendum)
Take delsym two tsp every 12 hours and supplement if needed with Demerol 50 mg up to 1 every 4 hours to suppress the urge to cough. Swallowing water or using ice chips/non mint and menthol containing candies (such as lifesavers or sugarless jolly ranchers) are also effective.  You should rest your voice and avoid activities that you know make you cough.  Once you have eliminated the cough for 3 straight days try reducing the Demerol first,  then the delsym as tolerated.     Increase gabapentin 300  Mg three times daily    Please schedule a follow up office visit in 6 weeks, call sooner if needed with all medications /inhalers/ solutions in hand so we can verify exactly what you are taking. This includes all medications from all doctors and over the counters and your portable oxygen supply

## 2017-07-16 NOTE — Progress Notes (Addendum)
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 Advice Only    self referral-pt recently hospitalized at [facility] for pna, was told to establish with Pulm for b/l plaques on lower lobes.    developed chronic min prod  Cough x 1-2 tsp clear mucus x around a decade prior to OV   esp in am's p stirring and some better rest the day but never really gone assoc with doe x parking lot or two flights Around Apr 11 2015  much more cough more production yellow mucus/ low grade admitted [facility] 11/6-11/16 for CAP cough is some better, fever gone energy not better and sleepy a lot more than usual despite use of cpap and has not notified Dr [name] and wishes a second opinion. 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 rec Do not drive a truck if you are sleepy at all  Pantoprazole (protonix) 40 mg   Take  30-60 min before first meal of the day and Pepcid (famotidine)  20 mg one @  bedtime until return to office - this is the best way to tell whether stomach acid is contributing to your problem.   GERD  Diet         07/04/2015  f/u ov/Wert re: cough/ osa  Chief Complaint  Patient presents with  . Follow-up    PFT and CXR done today. Pt c/o SOB for the past wk- walking "not very far". He also has some prod cough with clear sputum. He has been using neb 1-2 x per day on average for the past wk.    he states he was  feeling much better until he caught a cold over the last week but feels his symptoms are well controlled with the use of albuterol. rec mucinex dm up to 1200 mg every 12 hours as needed for cough  GERD  Work on weight loss  F/u 6 months: did not happen    03/13/16 NP eval [name] re flare of chronic cough ? CAP  rec  CXR > ? pna > rx doxy  We will schedule you for a swallow study. Follow the GERD diet. Continue Pantoprazole (protonix) 40 mg   Take  30-60 min before first meal of the day and Pepcid (famotidine)  20 mg one @  bedtime  Avoid mint, menthol and chocolate. Sips of water instead of throat clearing. Sugar free jolly ranchers for throat  Soothing. Delsym 1 teaspoon every 12 hours for cough.  CXR 03/13/2016:>> New opacity in the right upper lobe extending to the right apex. This is likely pneumonia. Neoplastic disease is possible.       03/22/2016  f/u ov/Wert re: cough flare ? ILD? Chief Complaint  Patient presents with  . Follow-up    Cough  had improved on abx and then worsened again once he finished med. He is still wheezing and having SOB.    hx is cough x 10 years, much worse toward end of September 2017 low grade fever, clear mucus Nebulizer helps some but hasn't used in several weeks  Cough really only improved while on cough suppression rec - FOB 04/11/2016 exophytic mass completely obst RUL > Sq cell ca > rec PET/ Oncology eval     04/19/2016  f/u ov/Wert re: cough x 10 years / new dx RUL obst with sq cell ca  Chief Complaint  Patient presents with  . Follow-up    Pt. states his breathing has remained unchanged, stilling coughing it has not changed  cough worse in evening p ppi each am but no hemoptysis rec Protonix 40 mg Take 30- 60 min before your first and last meals of the day  Take delsym two tsp every 12 hours and supplement if needed with  demerol 50 mg up to 1-2 every 4 hours to suppress the urge to cough.   rec Protonix 40 mg Take 30- 60 min before  your first and last meals of the day  Take delsym two tsp every 12 hours and supplement if needed with  demerol 50 mg up to 1-2 every 4 hours to suppress the urge to cough.  Once you have eliminated the cough for 3 straight days try reducing the demerol first,  then the delsym as tolerated.   Pulmonary follow up is as needed       06/17/2017  f/u ov/Wert re:  Cough x 11 years no change baseline before or after rx for lung ca "they've done all they can or plan to" in [employer]  Patient presents with  . Follow-up    cough continuously (productive), shortness of breath, wears 2 L O2   not clear used  demerol as instructed but records indicate he req refills w/in a month and rec rtc which did not do  Takes one tramadol 50 mg one at bedtime and it helps but then two makes him "crazy" and can't sleep  During the day feels the tessilon and wife's recipe honey lemon juice and olive oil work the best  Doe = MMRC3 = can't walk 100 yards even at a slow pace at a flat grade s stopping due to sob  Even on 02  Neb ? Helps sob / not cough  last used one day prior to OV - no better with prednisone in past   Mucus is minimal in am and mucoid  rec Gabapentin 100 mg three times daily as a new maintenance medication to eliminate cough long term   Take delsym two tsp every 12 hours and supplement if needed with  Demerol  50 mg up to 1- 2 every 4 hours    07/16/2017  f/u ov/Wert re:   cough  X 11 y better  Chief Complaint  Patient presents with  . Follow-up    PFT's done today. Cough has improved slightly.  He has been using his neb once per day on average.    Dyspnea:  .MMRC3 = can't walk 100 yards even at a slow pace at a flat grade s stopping due to sob  Even 02 2lpm  Cough: clear mucus s pattern x in am =Wakes up and coughs up 1 tbsp mucs over the first 15 min then better /  Sleep: ok on 02 2 pillows uses neb p supper/  before bed    No obvious day to day or daytime variability or assoc   purulent sputum or mucus plugs or hemoptysis or cp or chest tightness, subjective wheeze or overt sinus or hb symptoms. No unusual exposure hx or h/o childhood pna/ asthma or knowledge of premature birth.  Sleeping ok  2illows/ 2lpm without nocturnal  or early am exacerbation  of respiratory  c/o's or need for noct saba. Also denies any obvious fluctuation of symptoms with weather or environmental changes or other aggravating or alleviating factors except as outlined above   Current Allergies, Complete Past Medical History, Past Surgical History, Family History, and Social History were reviewed in [employer] record.  ROS  The following are not active complaints unless bolded Hoarseness, sore throat, dysphagia, dental problems, itching, sneezing,  nasal congestion or discharge of excess mucus or purulent secretions, ear ache,   fever, chills, sweats, unintended wt loss or wt gain, classically pleuritic or exertional cp,  orthopnea pnd or leg swelling, presyncope, palpitations, abdominal pain, anorexia, nausea, vomiting, diarrhea  or change in bowel habits or change in bladder habits, change in stools or change in urine, dysuria, hematuria,  rash, arthralgias, visual complaints, headache, numbness, weakness or ataxia or problems with walking or coordination,  change in mood/affect or memory.        Current Meds  Medication Sig  . amLODipine (NORVASC) 5 MG tablet Take 5 mg by mouth daily.  . benzonatate (TESSALON) 200 MG capsule Take 200 mg by mouth 3 (three) times daily.  . citalopram (CELEXA) 20 MG tablet Take 20 mg by mouth daily.   Marland Kitchen glipiZIDE (GLUCOTROL) 5 MG tablet Take 5 mg by mouth daily before breakfast.  . ipratropium-albuterol (DUONEB) 0.5-2.5 (3) MG/3ML SOLN Take 3 mLs by nebulization every 6 (six) hours as needed.  . metFORMIN (GLUCOPHAGE-XR) 500 MG 24 hr tablet Take 1,000 mg by mouth daily with breakfast.   . metoprolol tartrate (LOPRESSOR) 25 MG tablet Take 25 mg  by mouth 2 (two) times daily.  . pravastatin (PRAVACHOL) 40 MG tablet   . promethazine-dextromethorphan (PROMETHAZINE-DM) 6.25-15 MG/5ML syrup Take 5 mLs by mouth 4 (four) times daily as needed for cough.  . [DISCONTINUED] gabapentin (NEURONTIN) 100 MG capsule Take 1 capsule (100 mg total) by mouth 3 (three) times daily. One three times daily                                     Objective:   Physical Exam      07/16/2017            192  06/17/2017            195   04/19/2016       235  03/22/16 237 lb 6.4 oz (107.7 kg)  03/13/16 238 lb (108 kg)  01/01/16 232 lb 3.2 oz (105.3 kg)    Vital signs reviewed - Note on arrival 02 sats  93% on RA     HEENT: nl dentition, turbinates bilaterally, and oropharynx. Nl external ear canals without cough reflex   NECK :  without JVD/Nodes/TM/ nl carotid upstrokes bilaterally   LUNGS: no acc muscle use,  Nl contour chest with min insp and exp rhonchi bilaterally / no localized wheeze   CV:  RRR  no s3 or murmur or increase in P2, and no edema   ABD:  soft and nontender with nl inspiratory  excursion in the supine position. No bruits or organomegaly appreciated, bowel sounds nl  MS:  Nl gait/ ext warm without deformities, calf tenderness, cyanosis or clubbing No obvious joint restrictions   SKIN: warm and dry without lesions    NEURO:  alert, approp, nl sensorium with  no motor or cerebellar deficits apparent.               I personally reviewed images and agree with radiology impression as follows:   Chest CT 04/16/17  no progression in nodes/ met nodules/ small L effusion         Assessment & Plan:

## 2017-07-16 NOTE — Progress Notes (Signed)
PFT done today. 

## 2017-07-17 ENCOUNTER — Encounter: Payer: Self-pay | Admitting: Internal Medicine

## 2017-07-17 NOTE — Assessment & Plan Note (Signed)
PFT's  07/04/2015  FEV1 2/35 (76 % ) ratio 77  p 2 % improvement from saba with DLCO  73 % corrects to 101 % for alv volume   - Allergy profile 03/22/2016 >  Eos 0.2 /  IgE  17  Pos RAST cat/dog/dust - trial of gabapentin 06/17/2017 along with short term cyclical cough regimen > improved  - 07/16/2017 trial of gabapentin 300 tid    After 11 years of coughing he says he's finally gaining ground on the chronic cough but the real test is whether it will flare off demerol which should really only be used over 3-5 days to control the cyclical coughing and then d/c'd   I had an extended discussion with the patient/wife reviewing all relevant studies completed to date and  lasting 15 to 20 minutes of a 25 minute visit    Each maintenance medication was reviewed in detail including most importantly the difference between maintenance and prns and under what circumstances the prns are to be triggered using an action plan format that is not reflected in the computer generated alphabetically organized AVS.    Please see AVS for specific instructions unique to this visit that I personally wrote and verbalized to the the pt in detail and then reviewed with pt  by my nurse highlighting any  changes in therapy recommended at today's visit to their plan of care.

## 2017-07-17 NOTE — Assessment & Plan Note (Signed)
PFT/s  07/04/15  wnl  PFT's  07/16/2017  FEV1 1.13 (37 % ) ratio79  p no % improvement from saba p nothing prior to study with DLCO  28 % corrects to 95  % for alv volume    Most likely has had effects of lung ca/ RT/ chemo > restrictive changes unlikely to improve and no evidence of copd/ airflow obst but ok to use duoneb prn if he thinks it helps

## 2017-08-07 DIAGNOSIS — C3411 Malignant neoplasm of upper lobe, right bronchus or lung: Secondary | ICD-10-CM | POA: Diagnosis not present

## 2017-08-07 DIAGNOSIS — J918 Pleural effusion in other conditions classified elsewhere: Secondary | ICD-10-CM

## 2017-08-07 DIAGNOSIS — C771 Secondary and unspecified malignant neoplasm of intrathoracic lymph nodes: Secondary | ICD-10-CM

## 2017-08-07 DIAGNOSIS — C7801 Secondary malignant neoplasm of right lung: Secondary | ICD-10-CM | POA: Diagnosis not present

## 2017-08-07 DIAGNOSIS — C7802 Secondary malignant neoplasm of left lung: Secondary | ICD-10-CM | POA: Diagnosis not present

## 2017-08-22 DIAGNOSIS — C3411 Malignant neoplasm of upper lobe, right bronchus or lung: Secondary | ICD-10-CM

## 2017-09-02 ENCOUNTER — Ambulatory Visit: Payer: Medicare Other | Admitting: Internal Medicine

## 2017-09-12 DIAGNOSIS — J918 Pleural effusion in other conditions classified elsewhere: Secondary | ICD-10-CM | POA: Diagnosis not present

## 2017-09-12 DIAGNOSIS — C3411 Malignant neoplasm of upper lobe, right bronchus or lung: Secondary | ICD-10-CM | POA: Diagnosis not present

## 2017-09-27 IMAGING — CT CT PARANASAL SINUSES LIMITED
3 series · 15 of 47 positions shown, 18 images · non-contrast
Comparison: None.

CLINICAL DATA: Chronic cough, sinus drainage

EXAM:
CT PARANASAL SINUS LIMITED WITHOUT CONTRAST
TECHNIQUE: Non-contiguous multidetector CT images of the paranasal sinuses were
obtained in a single plane without contrast.

[Series 3: sinus 2.0 (person_name)30(person_name) (person_nam · axial · 0.37mm/px · z∈[+1271,+1387]mm · 9 of 68 slices shown, 12 images]
[im 5/68  brain]
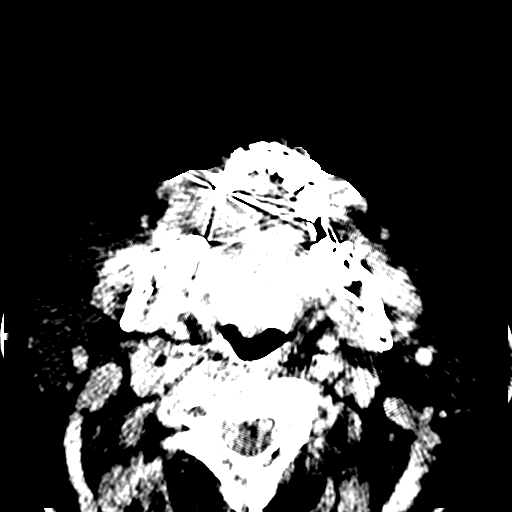
[im 5/68  bone]
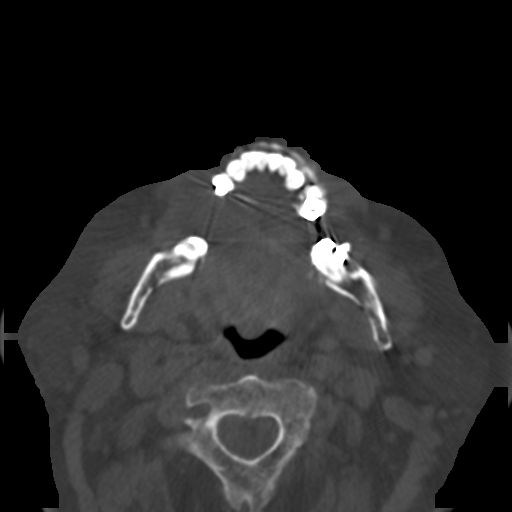
[im 12/68  bone]
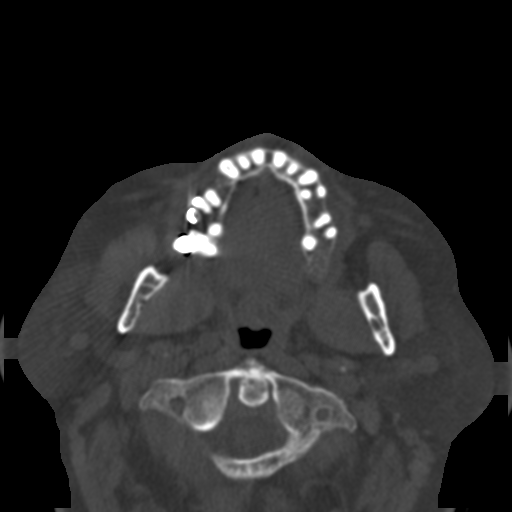
[im 19/68  bone]
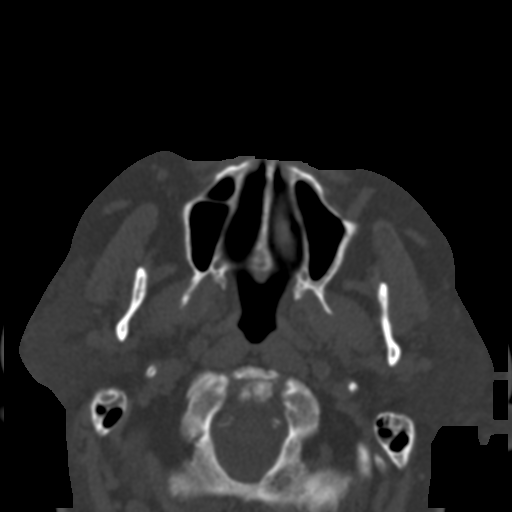
[im 26/68  bone]
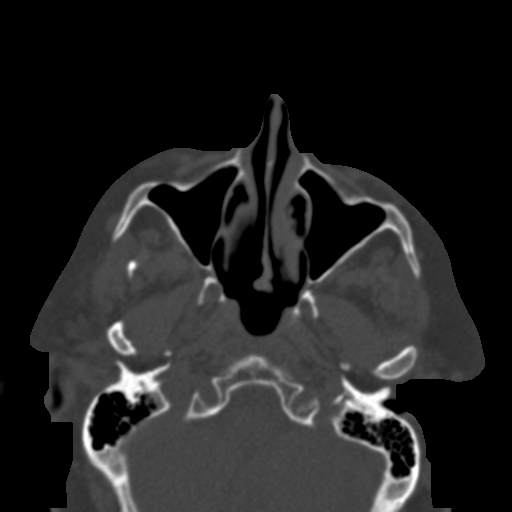
[im 35/68  brain]
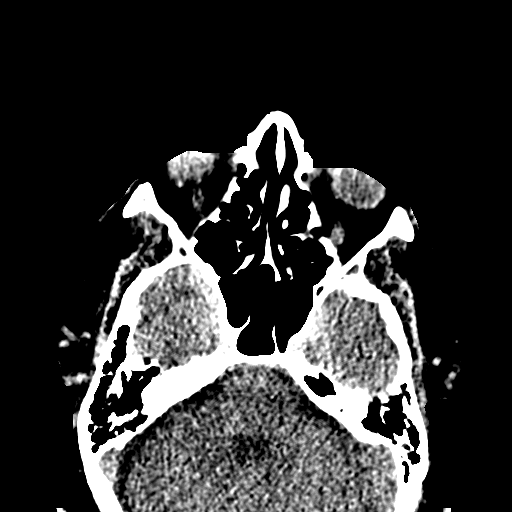
[im 35/68  bone]
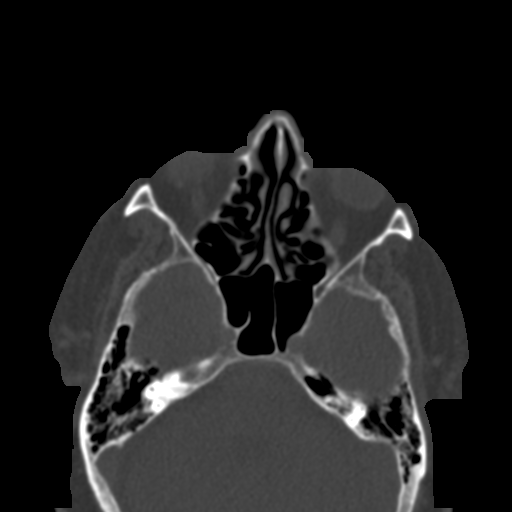
[im 42/68  bone]
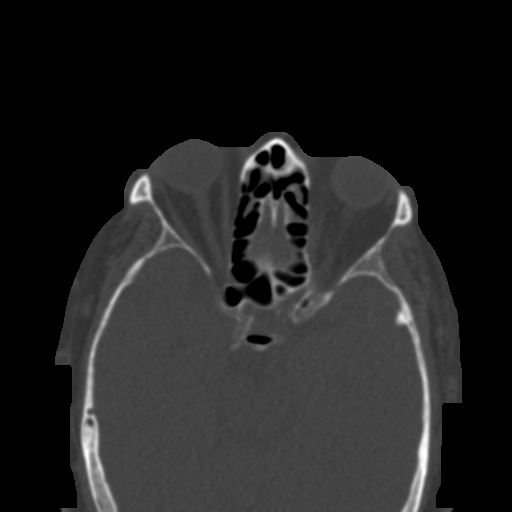
[im 49/68  bone]
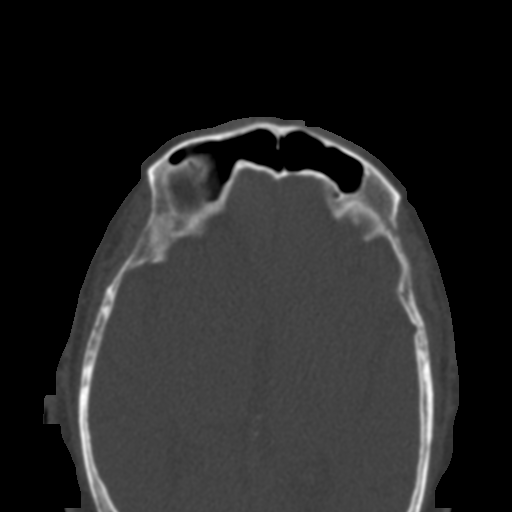
[im 56/68  bone]
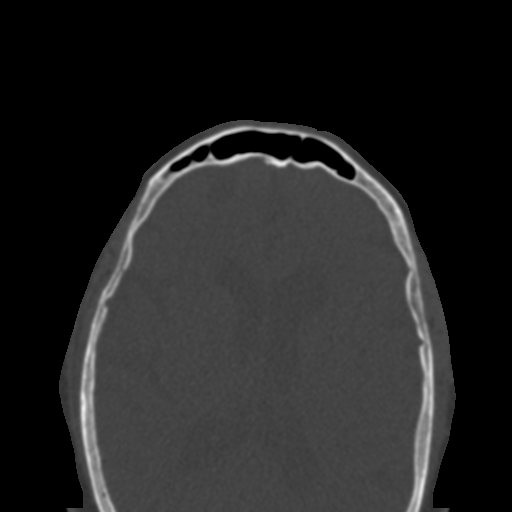
[im 63/68  brain]
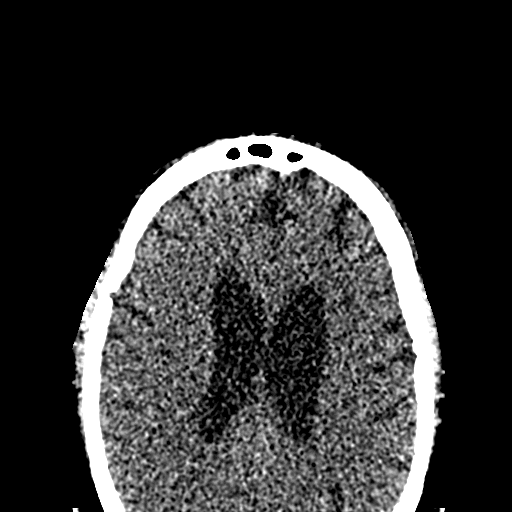
[im 63/68  bone]
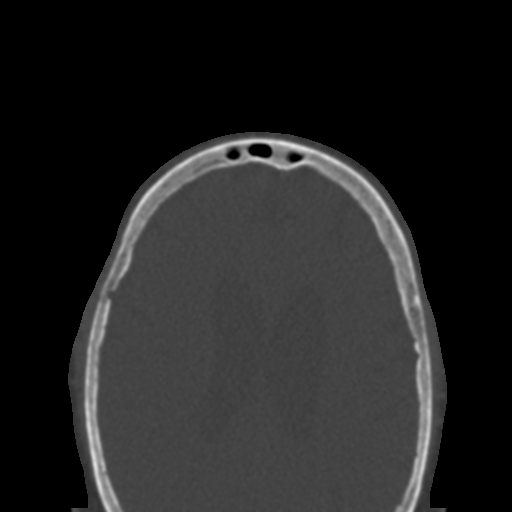

[Series 6: sinus 2.0 mpr cor · coronal · 0.29mm/px · 3 of 77 slices shown]
[im 26/77  bone]
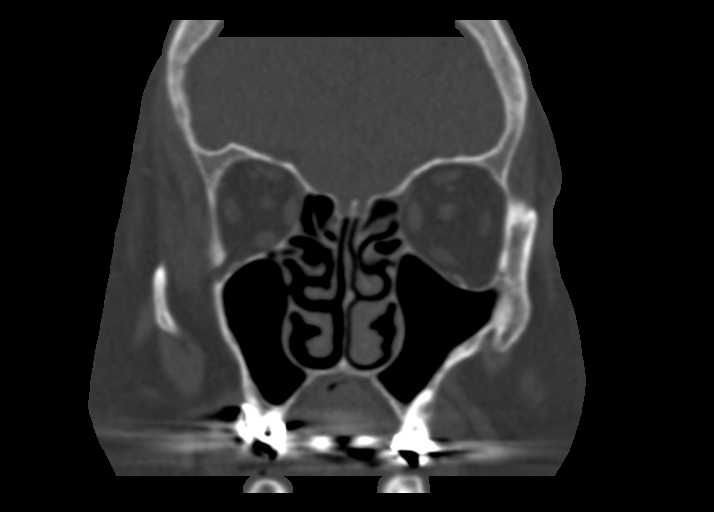
[im 34/77  bone]
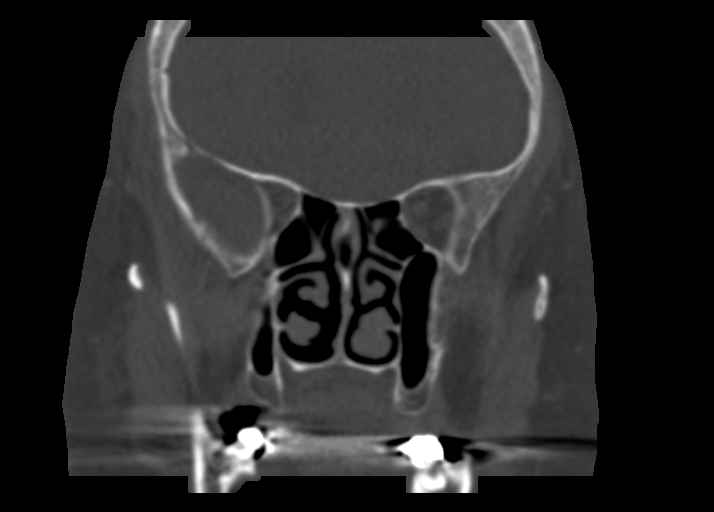
[im 43/77  bone]
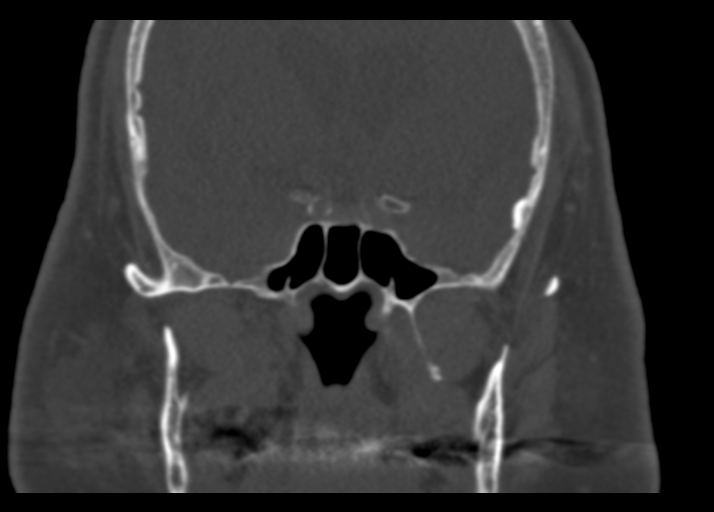

[Series 7: sinus 2.0 mpr sag · sagittal · 0.31mm/px · 3 of 84 slices shown]
[im 28/84  bone]
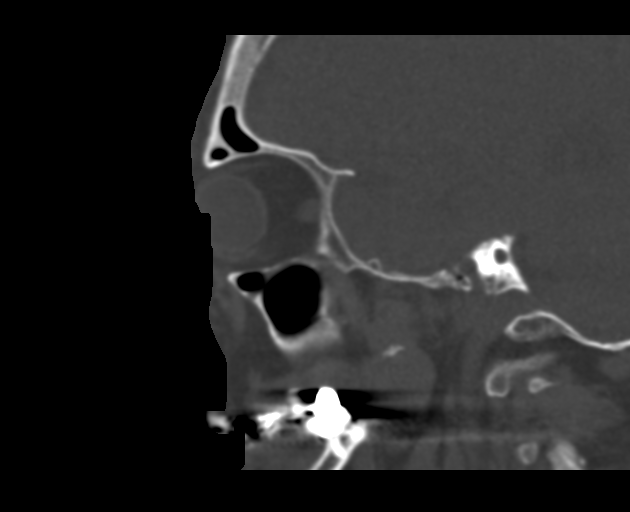
[im 42/84  bone]
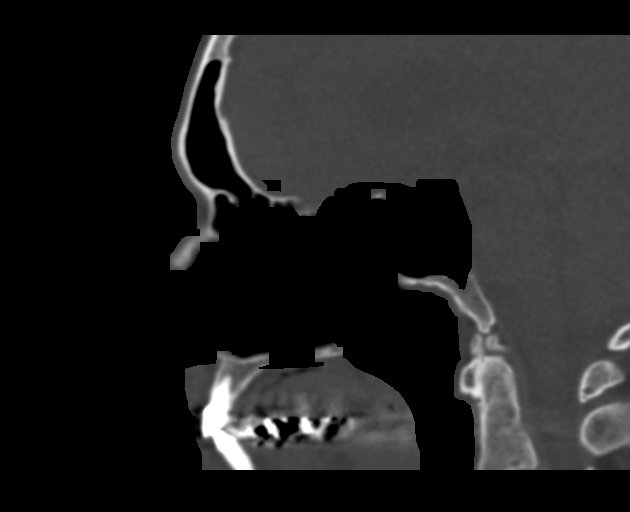
[im 56/84  bone]
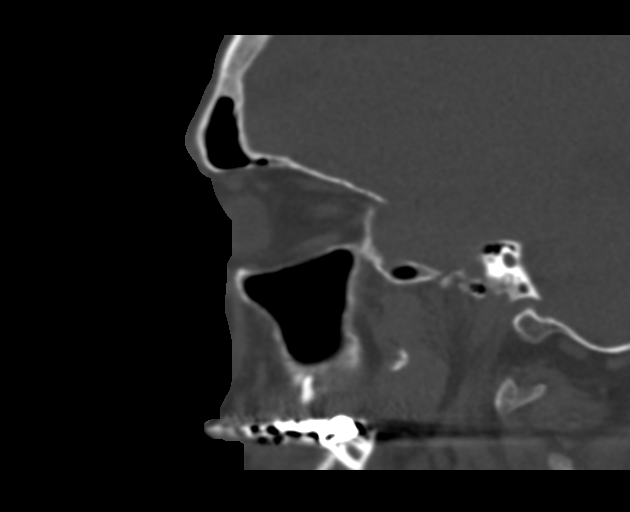

[15 of 47 positions shown; findings below may reference images not displayed]

FINDINGS: The paranasal sinuses including the sphenoid, ethmoid, frontal and
maxillary sinuses are identified without evidence of disease. No
air-fluid levels are identified. The nasal septum is midline. The
ostiomeatal complexes are patent bilaterally. The frontoethmoidal
recesses are normal bilaterally. No bony abnormalities are
identified. The visualized portions of the brain and orbits are
unremarkable.
IMPRESSION: No evidence of sinusitis.

## 2017-09-27 IMAGING — CT CT CHEST W/ CM
2 of 3 series · 14 of 36 positions shown, 17 images · IV contrast (Omni 300)
Comparison: Radiographs 03/13/2016.  CT 04/19/2015 and 08/09/2010.

CLINICAL DATA: Chronic cough and sinus drainage. Right upper lobe
collapse on recent radiographs. History of diabetes and prostate
cancer.

EXAM:
CT CHEST WITH CONTRAST
TECHNIQUE: Multidetector CT imaging of the chest was performed during
intravenous contrast administration.
CONTRAST:  75mL [id number] IOPAMIDOL ([id number]) INJECTION 61%

[Series 2: chest with 2mm st · axial · 0.77mm/px · z∈[+892,+1168]mm · 11 of 164 slices shown, 14 images]
[im 13/164  mediastinal]
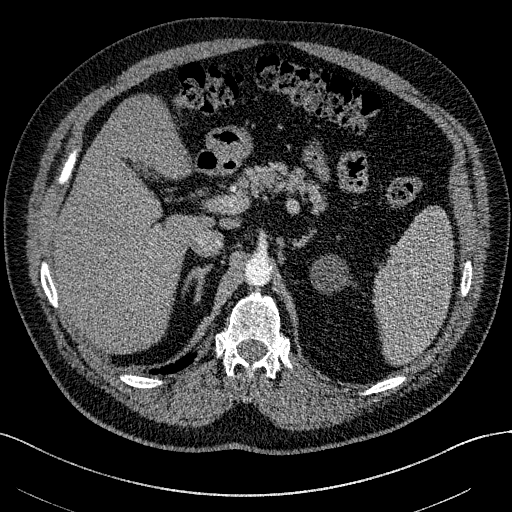
[im 13/164  lung]
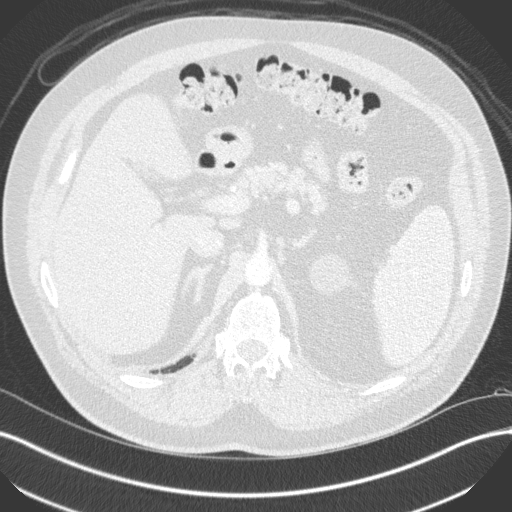
[im 25/164  lung]
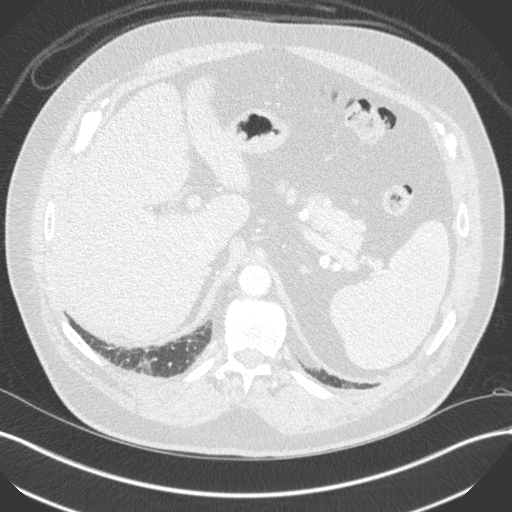
[im 37/164  lung]
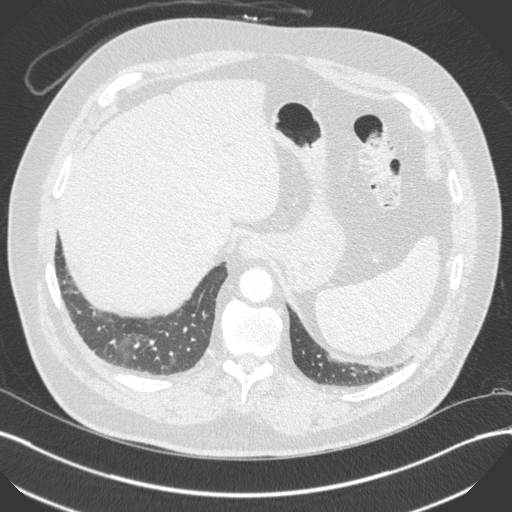
[im 55/164  lung]
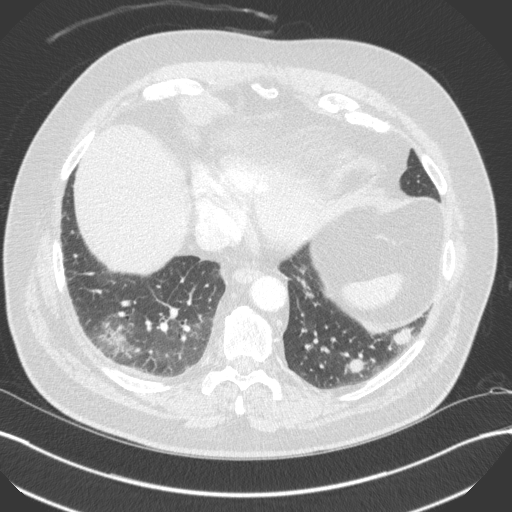
[im 67/164  mediastinal]
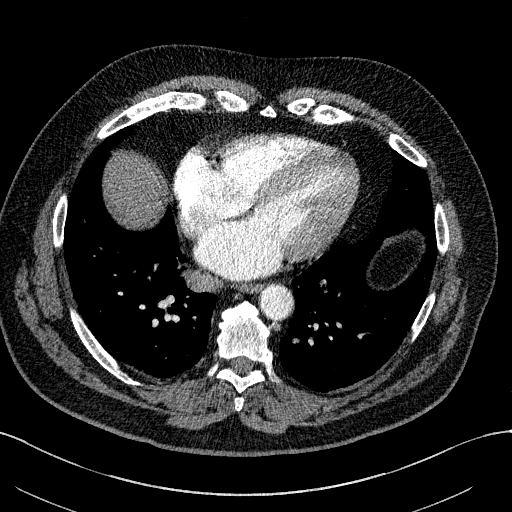
[im 67/164  lung]
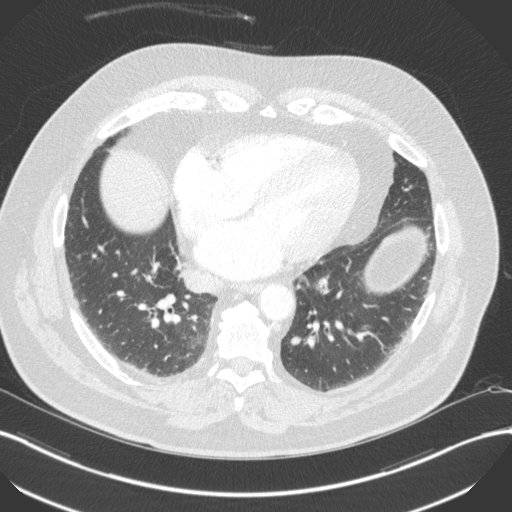
[im 85/164  lung]
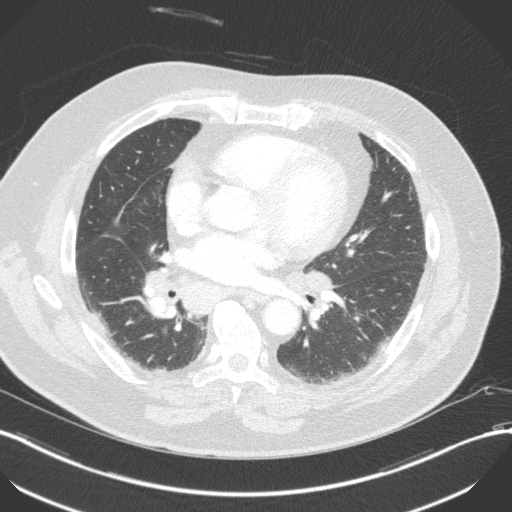
[im 97/164  lung]
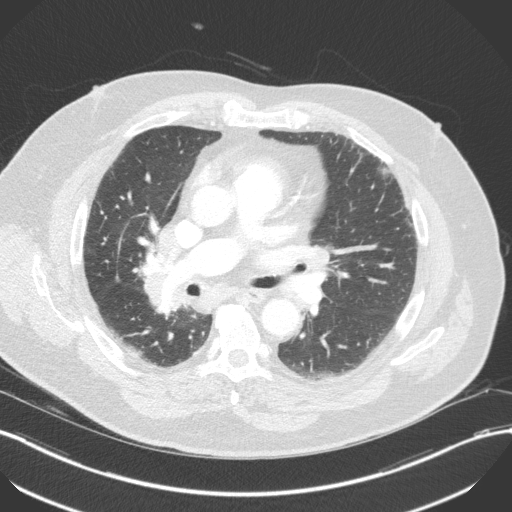
[im 109/164  lung]
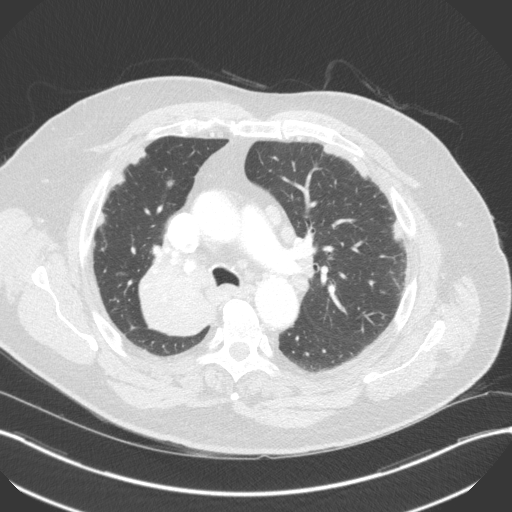
[im 127/164  mediastinal]
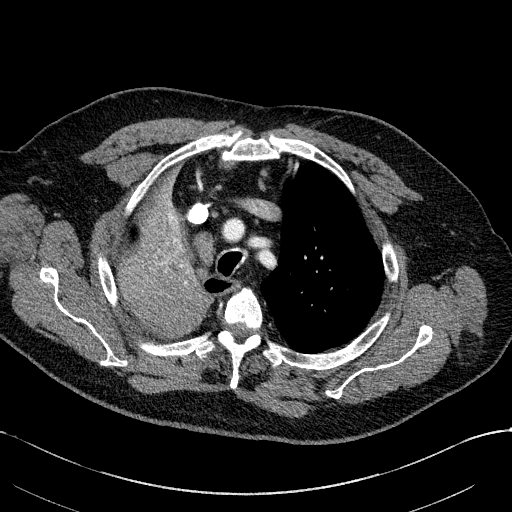
[im 127/164  lung]
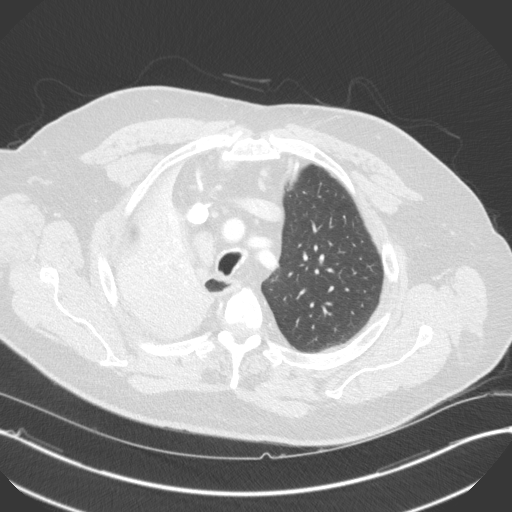
[im 139/164  lung]
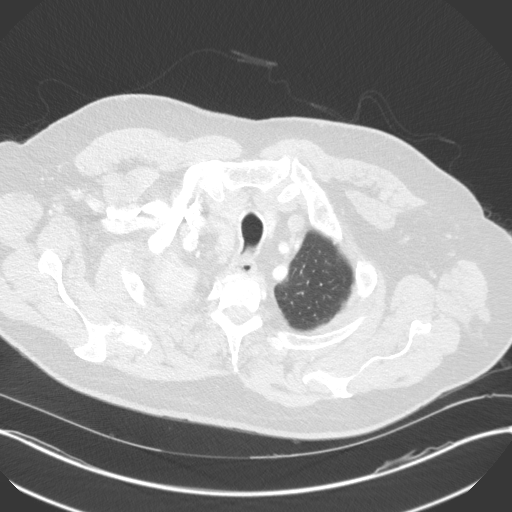
[im 151/164  lung]
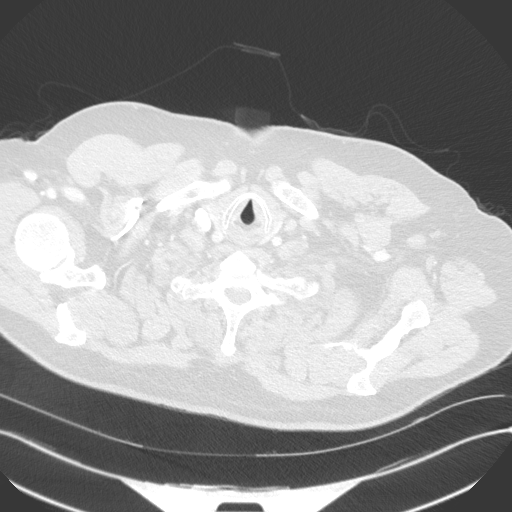

[Series 4: chest with 3mm st cor · coronal · 0.69mm/px · 3 of 96 slices shown]
[im 20/96  lung]
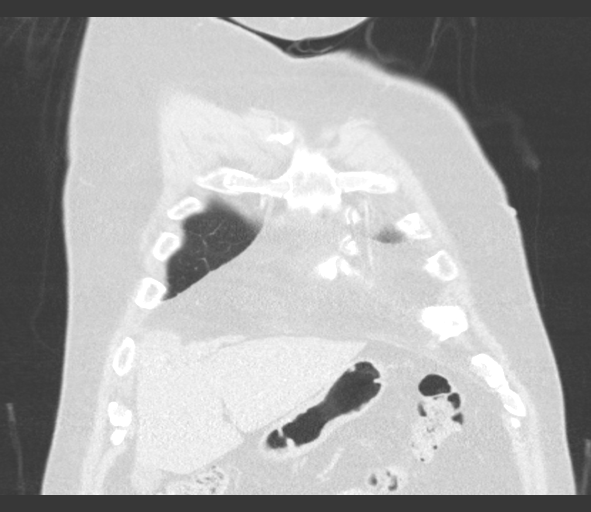
[im 39/96  lung]
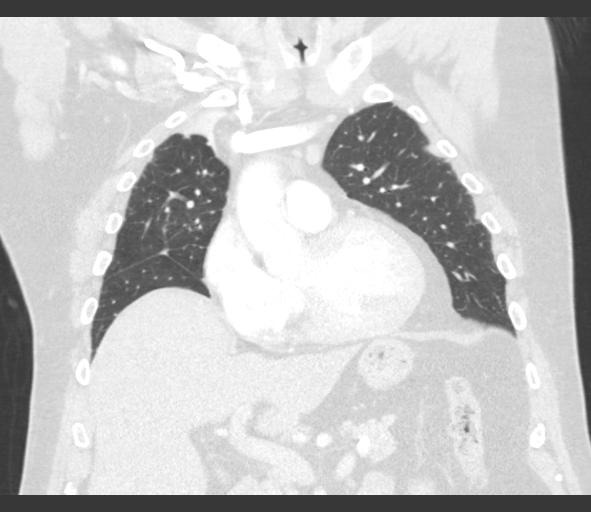
[im 58/96  lung]
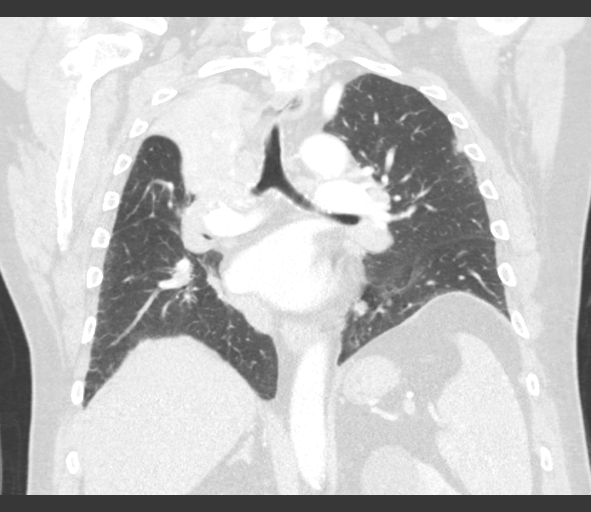

[14 of 36 positions shown; findings below may reference images not displayed]

FINDINGS: Cardiovascular: There is mild atherosclerosis of the aorta and
coronary arteries. No acute vascular findings are demonstrated. No
evidence of pulmonary embolism. The heart size is normal. There is
no pericardial effusion.

Mediastinum/Nodes: There are multiple enlarged mediastinal and hilar
lymph nodes bilaterally. Representative nodes include a 3.4 x 1.9 cm
right paratracheal node on image 44 and a subcarinal node measuring
3.4 x 2.4 cm on image 82. The thyroid gland, trachea and esophagus
demonstrate no significant findings.

Lungs/Pleura: Again demonstrated is extensive, predominantly non
calcified pleural plaque formation bilaterally. There is no
significant pleural effusion. There is complete right upper lobe
collapse with an abrupt cut off of the right upper lobe bronchus.
There is a probable central right suprahilar mass, partly obscured
by the adjacent lobar collapse. This is estimated to measure
approximately 4.9 x 4.1 cm on image 60. There is a 7 mm right middle
lobe nodule on image 58. There are numerous left lower lobe
pulmonary nodules, including an 11 x 13 mm nodule on image 95, a 12
x 14 mm nodule on image 110 and a 12 x 15 mm nodule on image 118. No
focal nodules are seen in the right lower or left upper lobes. There
is patchy right basilar ground-glass opacity.

Upper abdomen: No acute findings are seen within the visualized
upper abdomen. There is mild contour irregularity of the liver.
There is a cyst in the upper pole of the left kidney. No evidence of
adrenal mass. Small lymph nodes in the porta hepatis are stable.

Musculoskeletal/Chest wall: There is no chest wall mass or
suspicious osseous finding. Mild thoracic spondylosis.
IMPRESSION: 1. Right upper lobe collapse, likely secondary to a suprahilar mass
with endobronchial extension/cut off. There are additional pulmonary
nodules within the right middle and left lower lobes, as well as
mediastinal and hilar lymphadenopathy. Findings are highly
suspicious for metastatic bronchogenic carcinoma. Tissue sampling
via bronchoscopy recommended. PET-CT may be helpful for further
staging.
2. Underlying pleural plaque formation suspicious for asbestos
exposure. No typical findings of mesothelioma.
3. Mild atherosclerosis as described. Aortic Atherosclerosis
([id number]-170.0)
4. These results will be called to the ordering clinician or
representative by the Radiologist Assistant, and communication
documented in the PACS or zVision Dashboard.

## 2017-11-10 DIAGNOSIS — R630 Anorexia: Secondary | ICD-10-CM

## 2017-11-10 DIAGNOSIS — R627 Adult failure to thrive: Secondary | ICD-10-CM | POA: Diagnosis not present

## 2017-11-10 DIAGNOSIS — Z9981 Dependence on supplemental oxygen: Secondary | ICD-10-CM

## 2017-11-10 DIAGNOSIS — C3411 Malignant neoplasm of upper lobe, right bronchus or lung: Secondary | ICD-10-CM | POA: Diagnosis not present

## 2017-11-10 DIAGNOSIS — R109 Unspecified abdominal pain: Secondary | ICD-10-CM

## 2018-01-08 DEATH — deceased
